# Patient Record
Sex: Female | Born: 1965 | Race: White | Hispanic: No | Marital: Married | Smoking: Never smoker
Health system: Southern US, Community
[De-identification: ages and names within clinical notes are randomized; demographics above are authoritative.]

## PROBLEM LIST (undated history)

## (undated) DIAGNOSIS — Z973 Presence of spectacles and contact lenses: Secondary | ICD-10-CM

## (undated) DIAGNOSIS — G43909 Migraine, unspecified, not intractable, without status migrainosus: Secondary | ICD-10-CM

## (undated) DIAGNOSIS — Z9109 Other allergy status, other than to drugs and biological substances: Secondary | ICD-10-CM

## (undated) DIAGNOSIS — R51 Headache: Secondary | ICD-10-CM

## (undated) DIAGNOSIS — Q359 Cleft palate, unspecified: Secondary | ICD-10-CM

## (undated) DIAGNOSIS — R002 Palpitations: Secondary | ICD-10-CM

## (undated) DIAGNOSIS — T7840XA Allergy, unspecified, initial encounter: Secondary | ICD-10-CM

## (undated) DIAGNOSIS — K219 Gastro-esophageal reflux disease without esophagitis: Secondary | ICD-10-CM

## (undated) DIAGNOSIS — J45909 Unspecified asthma, uncomplicated: Secondary | ICD-10-CM

## (undated) DIAGNOSIS — R519 Headache, unspecified: Secondary | ICD-10-CM

## (undated) DIAGNOSIS — E039 Hypothyroidism, unspecified: Secondary | ICD-10-CM

## (undated) HISTORY — DX: Hypothyroidism, unspecified: E03.9

## (undated) HISTORY — DX: Cleft palate, unspecified: Q35.9

## (undated) HISTORY — DX: Migraine, unspecified, not intractable, without status migrainosus: G43.909

## (undated) HISTORY — DX: Headache, unspecified: R51.9

## (undated) HISTORY — PX: CLEFT PALATE REPAIR: SUR1165

## (undated) HISTORY — DX: Other allergy status, other than to drugs and biological substances: Z91.09

## (undated) HISTORY — PX: COLONOSCOPY: SHX174

## (undated) HISTORY — DX: Unspecified asthma, uncomplicated: J45.909

## (undated) HISTORY — DX: Palpitations: R00.2

## (undated) HISTORY — DX: Gastro-esophageal reflux disease without esophagitis: K21.9

## (undated) HISTORY — DX: Allergy, unspecified, initial encounter: T78.40XA

## (undated) HISTORY — DX: Headache: R51

---

## 1998-10-22 ENCOUNTER — Other Ambulatory Visit: Admission: RE | Admit: 1998-10-22 | Discharge: 1998-10-22 | Payer: Self-pay | Admitting: *Deleted

## 1999-09-23 ENCOUNTER — Other Ambulatory Visit: Admission: RE | Admit: 1999-09-23 | Discharge: 1999-09-23 | Payer: Self-pay | Admitting: *Deleted

## 2000-09-23 ENCOUNTER — Encounter: Admission: RE | Admit: 2000-09-23 | Discharge: 2000-09-23 | Payer: Self-pay | Admitting: Family Medicine

## 2001-08-28 ENCOUNTER — Other Ambulatory Visit: Admission: RE | Admit: 2001-08-28 | Discharge: 2001-08-28 | Payer: Self-pay | Admitting: *Deleted

## 2002-09-24 ENCOUNTER — Other Ambulatory Visit: Admission: RE | Admit: 2002-09-24 | Discharge: 2002-09-24 | Payer: Self-pay | Admitting: *Deleted

## 2004-07-20 ENCOUNTER — Other Ambulatory Visit: Admission: RE | Admit: 2004-07-20 | Discharge: 2004-07-20 | Payer: Self-pay | Admitting: Obstetrics and Gynecology

## 2005-03-23 ENCOUNTER — Ambulatory Visit (HOSPITAL_COMMUNITY): Admission: RE | Admit: 2005-03-23 | Discharge: 2005-03-23 | Payer: Self-pay | Admitting: Obstetrics and Gynecology

## 2005-03-26 ENCOUNTER — Encounter: Admission: RE | Admit: 2005-03-26 | Discharge: 2005-03-26 | Payer: Self-pay | Admitting: Gastroenterology

## 2005-04-07 ENCOUNTER — Encounter: Admission: RE | Admit: 2005-04-07 | Discharge: 2005-04-07 | Payer: Self-pay | Admitting: Obstetrics and Gynecology

## 2005-04-19 ENCOUNTER — Ambulatory Visit (HOSPITAL_COMMUNITY): Admission: RE | Admit: 2005-04-19 | Discharge: 2005-04-19 | Payer: Self-pay | Admitting: Gastroenterology

## 2005-08-30 ENCOUNTER — Other Ambulatory Visit: Admission: RE | Admit: 2005-08-30 | Discharge: 2005-08-30 | Payer: Self-pay | Admitting: Obstetrics and Gynecology

## 2005-10-06 ENCOUNTER — Encounter: Admission: RE | Admit: 2005-10-06 | Discharge: 2005-10-06 | Payer: Self-pay | Admitting: Obstetrics and Gynecology

## 2008-01-24 ENCOUNTER — Encounter: Admission: RE | Admit: 2008-01-24 | Discharge: 2008-01-24 | Payer: Self-pay | Admitting: Obstetrics and Gynecology

## 2008-02-16 ENCOUNTER — Encounter: Admission: RE | Admit: 2008-02-16 | Discharge: 2008-02-16 | Payer: Self-pay | Admitting: Gastroenterology

## 2009-02-04 ENCOUNTER — Encounter: Admission: RE | Admit: 2009-02-04 | Discharge: 2009-02-04 | Payer: Self-pay | Admitting: Obstetrics and Gynecology

## 2010-02-06 ENCOUNTER — Encounter: Admission: RE | Admit: 2010-02-06 | Discharge: 2010-02-06 | Payer: Self-pay | Admitting: Obstetrics and Gynecology

## 2011-02-15 ENCOUNTER — Other Ambulatory Visit: Payer: Self-pay | Admitting: Obstetrics and Gynecology

## 2011-02-15 DIAGNOSIS — Z1231 Encounter for screening mammogram for malignant neoplasm of breast: Secondary | ICD-10-CM

## 2011-02-19 ENCOUNTER — Ambulatory Visit
Admission: RE | Admit: 2011-02-19 | Discharge: 2011-02-19 | Disposition: A | Discharge status: Home / Self Care | Payer: Commercial Indemnity | Source: Ambulatory Visit | Attending: Obstetrics and Gynecology | Admitting: Obstetrics and Gynecology

## 2011-02-19 DIAGNOSIS — Z1231 Encounter for screening mammogram for malignant neoplasm of breast: Secondary | ICD-10-CM

## 2011-03-05 NOTE — Op Note (Signed)
NAME:  Karen Becker, Karen Becker NO.:  [id number]   MEDICAL RECORD NO.:  [id number]          PATIENT TYPE:  AMB   LOCATION:  ENDO                         FACILITY:  [facility]   PHYSICIAN:  Anselmo Rod, M.D.  DATE OF BIRTH:  07-30-1966   DATE OF PROCEDURE:  04/19/2005  DATE OF DISCHARGE:                                 OPERATIVE REPORT   PROCEDURE PERFORMED:  Colonoscopy up to the terminal ileum.   ENDOSCOPIST:  Charna Elizabeth, M.D.   INSTRUMENT USED:  Olympus video colonoscope.   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  Rule out inflammatory  bowel disease.   PREPROCEDURE PREPARATION:  Informed consent was procured from the patient.  The patient was fasted for eight hours prior to the procedure and prepped  with a bottle of magnesium citrate and a gallon of GoLYTELY the night prior  to the procedure.  The risks and benefits of the procedure including a 10%  miss rate for colon polyps or cancers was discussed with the patient as  well.   PREPROCEDURE PHYSICAL:  The patient had stable vital signs.  Neck supple.  Chest clear to auscultation.  S1 and S2 regular.  Abdomen soft with normal  bowel sounds.   DESCRIPTION OF PROCEDURE:  The patient was placed in left lateral decubitus  position and sedated with 100 mg of Demerol and 10 mg of Versed in slow  incremental doses.  Once the patient was adequately sedated and maintained  on low flow oxygen and continuous cardiac monitoring, the Olympus video  colonoscope was advanced from the rectum to the hepatic flexure with  difficulty.  The patient had significant discomfort and therefore the adult  scope was withdrawn and the pediatric adjustable scope used instead.  The  patient's position was changed from the left lateral to the supine position  with gentle  application of abdominal pressure to reach the cecum.  The  appendicular orifice and ileocecal valve were clearly visualized and  photographed.  The terminal ileum appeared healthy.  The entire colonic  mucosa appeared healthy with a normal vascular pattern.  Retroflexion in the  rectum revealed no abnormalities. There was no evidence of Crohn's disease,  no ulcers, erosions, masses, polyps or diverticula were noted.  The patient  had some abdominal discomfort with inflation of air into the colon  indicating a component of visceral hypersensitivity.   IMPRESSION:  1.  Normal colonoscopy up to the terminal ileum.  2.  Indication of visceral hypersensitivity as the patient had abdominal      discomfort with insufflation of air into the colon consistent with      irritable bowel syndrome.   RECOMMENDATIONS:  1.  Continue high fiber diet with liberal fluid intake.  2.  Zelnorm 2 mg by mouth daily.  3.  Outpatient followup as need arises in the future.       JNM/MEDQ  D:  04/19/2005  T:  04/19/2005  Job:  [id number]   cc:   Griffith Citron Mohorn, D.D.S.  [address] [place] [address]  [place], [place] [address]  Fax: [phone]   Bing Neighbors. Sydnee Cabal, MD  Fax: [phone]

## 2011-05-19 ENCOUNTER — Encounter: Payer: Self-pay | Admitting: Podiatrist

## 2012-02-16 ENCOUNTER — Other Ambulatory Visit: Payer: Self-pay | Admitting: Obstetrics and Gynecology

## 2012-02-16 DIAGNOSIS — Z1231 Encounter for screening mammogram for malignant neoplasm of breast: Secondary | ICD-10-CM

## 2012-03-15 ENCOUNTER — Ambulatory Visit
Admission: RE | Admit: 2012-03-15 | Discharge: 2012-03-15 | Disposition: A | Discharge status: Home / Self Care | Payer: Managed Care, Other (non HMO) | Source: Ambulatory Visit | Attending: Obstetrics and Gynecology | Admitting: Obstetrics and Gynecology

## 2012-03-15 DIAGNOSIS — Z1231 Encounter for screening mammogram for malignant neoplasm of breast: Secondary | ICD-10-CM

## 2013-02-05 ENCOUNTER — Other Ambulatory Visit: Payer: Self-pay

## 2013-02-05 DIAGNOSIS — Z1231 Encounter for screening mammogram for malignant neoplasm of breast: Secondary | ICD-10-CM

## 2013-03-19 ENCOUNTER — Ambulatory Visit
Admission: RE | Admit: 2013-03-19 | Discharge: 2013-03-19 | Disposition: A | Discharge status: Home / Self Care | Payer: Managed Care, Other (non HMO) | Source: Ambulatory Visit

## 2013-03-19 DIAGNOSIS — Z1231 Encounter for screening mammogram for malignant neoplasm of breast: Secondary | ICD-10-CM

## 2013-10-18 HISTORY — PX: ABLATION: SHX5711

## 2013-11-26 ENCOUNTER — Encounter: Payer: Self-pay | Admitting: Neurology

## 2013-11-26 ENCOUNTER — Ambulatory Visit (INDEPENDENT_AMBULATORY_CARE_PROVIDER_SITE_OTHER): Payer: Managed Care, Other (non HMO) | Admitting: Neurology

## 2013-11-26 ENCOUNTER — Encounter (INDEPENDENT_AMBULATORY_CARE_PROVIDER_SITE_OTHER): Payer: Self-pay

## 2013-11-26 VITALS — BP 109/71 | HR 73 | Ht 63.5 in | Wt 128.2 lb

## 2013-11-26 DIAGNOSIS — R413 Other amnesia: Secondary | ICD-10-CM

## 2013-11-26 DIAGNOSIS — G3184 Mild cognitive impairment, so stated: Secondary | ICD-10-CM

## 2013-11-26 MED ORDER — CEREFOLIN 6-1-50-5 MG PO TABS: 1.0000 | ORAL_TABLET | Freq: Every morning | ORAL | Status: DC

## 2013-11-26 NOTE — Patient Instructions (Signed)
I had a long discussion with the patient with regards to her mild memory impairment and discussed her risk of developing Alzheimer's given family history of the same in her mother. We also discussed at length the available blood test for APOE genetic subtyping testing as well as PET scan with amyloid labeling to detect patients at risk for Alzheimer's. However since we do not have any effective treatment strategies to lower her risk for developing Alzheimer`s  I do not believe doing extensive diagnostic testing is indicated at the present time. I recommend she try Cerefolin nac 1 tablet daily., fish oil capsule daily and participate in cognitively challenging activities like crossword puzzles,sudoku and playing bridge.. Return for followup in 6 months or call earlier if necessary

## 2013-11-26 NOTE — Progress Notes (Signed)
[facility] [address] [place]. [place] [address] [phone]       OFFICE CONSULT NOTE  [name] Date of Birth:  07-22-1966 Medical Record Number:  [id number]   Referring MD:  Maurice Small  Reason for Referral:  Memory difficulties and fear of Alzheimer`s   HPI: 23 year Caucasian lady whose mother was diagnosed by me with Alzheimer`s last December and has been worried that she may be at risk for the same and is here to discuss doing genetic testing if it will be helpful. She admits to being under stress from doing 2 jobs and helping with her mom moving back to [place] recently. She has difficulty remembering names and faces of clients whom she has seen has met once. She can remember past information well and can perform her tasks without problem.She denies significant headache, weakness, gait difficulty, fall, head injury with loss of consciousness, stroke, seizures. She has h/o rare migraines usually brought on by being on vacation.She has remote h/o trigeminal neuralgia years ago and had brain MRI in [place] but I do not have report or films to review today. She denies h/o depression but admit to being very anxious recently.  ROS:   14 system review of systems is positive for memory loss, anxeity, depression and migraines only and all other systems negative  PMH:  Past Medical History  Diagnosis Date  . GERD (gastroesophageal reflux disease)   . Hypothyroidism   . Environmental allergies   . 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 Marital Status: Married    Spouse Name: bill    Number of Children: 0  . Years of Education: college   Occupational History  . IT sales    Social History Main Topics  . Smoking status: Never Smoker   . Smokeless tobacco: Not on file  . Alcohol Use: Yes  . Drug Use: No  . Sexual Activity: Not on file   Other Topics Concern  . Not on file   Social History Narrative  .  No narrative on file    Medications:   Current Outpatient Prescriptions on File Prior to Visit  Medication Sig Dispense Refill  . esomeprazole (NEXIUM) 40 MG capsule Take 40 mg by mouth daily before breakfast.        . Evening Primrose Oil 1000 MG CAPS Take by mouth.        . levocetirizine (XYZAL) 5 MG tablet Take 10 mg by mouth daily.       Marland Kitchen levothyroxine (SYNTHROID, LEVOTHROID) 112 MCG tablet Take 112 mcg by mouth daily.         No current facility-administered medications on file prior to visit.    Allergies:   Allergies  Allergen Reactions  . Adhesive [Tape]   . Latex     Physical Exam General: well developed, well nourished, seated, in no evident distress Head: head normocephalic and atraumatic. Orohparynx benign Neck: supple with no carotid or supraclavicular bruits Cardiovascular: regular rate and rhythm, no murmurs Musculoskeletal: no deformity Skin:  no rash/petichiae Vascular:  Normal pulses all extremities Filed Vitals:   11/26/13 1428  BP: 109/71  Pulse: 73    Neurologic Exam Mental Status: Awake and fully alert. Oriented to place and time. Recent and remote memory intact. Attention span, concentration and fund of knowledge appropriate. Mood and affect appropriate. MMSE 29/30 with deficit in drawing only. MOCA 26/30 with deficits  in delayed recall. Geriatric Depression scale not depressed. Cranial Nerves: Fundoscopic exam reveals sharp disc margins. Pupils equal, briskly reactive to light. Extraocular movements full without nystagmus. Visual fields full to confrontation. Hearing intact. Facial sensation intact. Face, tongue, palate moves normally and symmetrically.  Motor: Normal bulk and tone. Normal strength in all tested extremity muscles. Sensory.: intact to touch and pinprick and vibratory sensation.  Coordination: Rapid alternating movements normal in all extremities. Finger-to-nose and heel-to-shin performed accurately bilaterally. Gait and Station:  Arises from chair without difficulty. Stance is normal. Gait demonstrates normal stride length and balance . Able to heel, toe and tandem walk without difficulty.  Reflexes: 1+ and symmetric. Toes downgoing.    ASSESSMENT:  37 year Caucasian lady with subjective mild memory difficulties but unremarkable cognitive testing- probably anxeity  Rather than  mild cognitive impairment. Patient has family h/o alzheimer`s and significant anticipatory anxeity over this.   PLAN: I  had a long discussion with the patient with regards to her mild memory impairment and discussed her risk of developing Alzheimer's given family history of the same in her mother. We also discussed at length the available blood test for APOE genetic subtyping testing as well as PET scan with amyloid labeling to detect patients at risk for Alzheimer's. However since we do not have any effective treatment strategies to lower her risk for developing Alzheimer`s  I do not believe doing extensive diagnostic testing is indicated at the present time. I recommend she try Cerefolin nac 1 tablet daily., fish oil capsule daily and participate in cognitively challenging activities like crossword puzzles,sudoku and playing bridge.I recommend conservative f/u for now and will not order any diagnostic testing unless she gets worse . Return for followup in 6 months or call earlier if necessary   Note: This document was prepared with digital dictation and possible smart phrase technology. Any transcriptional errors that result from this process are unintentional.

## 2013-12-11 ENCOUNTER — Telehealth: Payer: Self-pay | Admitting: Neurology

## 2013-12-11 MED ORDER — CEREFOLIN NAC 6-90.314-2-600 MG PO TABS: 1.0000 | ORAL_TABLET | Freq: Every day | ORAL | Status: DC

## 2013-12-11 NOTE — Telephone Encounter (Signed)
States medication was filled wrong and should not have been sent to her insurance company but acquired through [facility]. She has received the generic brand and states the ingredients are not the same and she wants the actual brand name. Also wants to be called when this has been handled.

## 2013-12-11 NOTE — Telephone Encounter (Signed)
By viewing the chart, it appears at last OV [facility]  Patient prefers to use [facility].  I have resent the Rx to them.  I called the patient back.  Got no answer.  Left message.

## 2014-01-15 ENCOUNTER — Ambulatory Visit (INDEPENDENT_AMBULATORY_CARE_PROVIDER_SITE_OTHER): Payer: Managed Care, Other (non HMO) | Admitting: Cardiology

## 2014-01-15 ENCOUNTER — Encounter: Payer: Self-pay | Admitting: Cardiology

## 2014-01-15 VITALS — BP 100/62 | HR 62 | Ht 64.0 in | Wt 126.0 lb

## 2014-01-15 DIAGNOSIS — R002 Palpitations: Secondary | ICD-10-CM | POA: Insufficient documentation

## 2014-01-15 NOTE — Patient Instructions (Signed)
Your physician recommends that you continue on your current medications as directed. Please refer to the Current Medication list given to you today.   Your physician recommends that you schedule a follow-up appointment as needed  

## 2014-01-15 NOTE — Progress Notes (Signed)
[address] [place], [place]  [address] Phone: [phone] Fax:  [phone]  Date:  01/15/2014   ID:  Karen Becker, DOB April 09, 1966, MRN [id number]  PCP:  Frederich Chick, MD   History of Present Illness: Karen Becker is a 48 y.o. female here for evaluation of palpitations. She's been having palpitations at least once per week over the past several months usually lasting a few seconds duration with no associated pain. 4 weeks ago was having 3 a day. Has not had one in a week. Pre menstrual week more palpitations. She is very physically active, no syncope, no heart failure, no family history of sudden death or atrial fibrillation. Her mother was recently diagnosed and grandfather with atrial fibrillation. Thyroid/TSH was normal. Rare caffeinated drinks. She has never smoked. She is an [employer]. Creatinine is 0.94, potassium 4.0, hemoglobin 13.8, magnesium 1.9, LDL cholesterol 104, HDL 55. Prior note from Dr. Hyman Hopes reviewed.  Mother stroke atrial fibrillation. I took care of her while she was here in town. She is now back in [place].   Wt Readings from Last 3 Encounters:  01/15/14 126 lb (57.153 kg)  11/26/13 128 lb 3.2 oz (58.151 kg)     Past Medical History  Diagnosis Date  . GERD (gastroesophageal reflux disease)   . Hypothyroidism   . Environmental allergies   . 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 Ablation      Tonsils  . Cleft palate repair      Current Outpatient Prescriptions  Medication Sig Dispense Refill  . albuterol (ACCUNEB) 1.25 MG/3ML nebulizer solution Take 1 ampule by nebulization every 6 (six) hours as needed for wheezing (sport induced).      Marland Kitchen buPROPion (WELLBUTRIN XL) 300 MG 24 hr tablet Take 300 mg by mouth daily. Pt taking 150 and 300 mg alternating      . cetirizine (ZYRTEC) 10 MG tablet Take 10 mg by mouth daily.      Marland Kitchen esomeprazole (NEXIUM) 40 MG capsule Take 40 mg by mouth  daily before breakfast.        . Evening Primrose Oil 1000 MG CAPS Take by mouth.        Bennetta Laos Factor (INTRINSI B12-FOLATE PO) Take by mouth.      . IPRATROPIUM BROMIDE IN Inhale into the lungs.      Marland Kitchen levothyroxine (SYNTHROID, LEVOTHROID) 112 MCG tablet Take 112 mcg by mouth daily.        . LO LOESTRIN FE 1 MG-10 MCG / 10 MCG tablet       . Methylfol-Algae-B12-Acetylcyst (CEREFOLIN NAC) 6-90.314-2-600 MG TABS Take 1 tablet by mouth daily.  90 tablet  1  . Saccharomyces boulardii (FLORASTOR PO) Take by mouth.       No current facility-administered medications for this visit.    Allergies:    Allergies  Allergen Reactions  . Adhesive [Tape]   . Latex     Social History:  The patient  reports that she has never smoked. She does not have any smokeless tobacco history on file. She reports that she drinks alcohol. She reports that she does not use illicit drugs.   Family History  Problem Relation Age of Onset  . Alzheimer's disease Mother   . Atrial fibrillation Mother   . Bipolar disorder Father   . Diabetes Mellitus II Father     ROS:  Please see the history of present illness.   Denies any syncope, bleeding, orthopnea, PND, chest pain, shortness of breath   All other systems reviewed and negative.   PHYSICAL EXAM: VS:  Ht 5\' 4"  (1.626 m)  Wt 126 lb (57.153 kg)  BMI 21.62 kg/m2  LMP 12/31/2013 Well nourished, well developed, in no acute distress HEENT: normal, [place]/AT, EOMI Neck: no JVD, normal carotid upstroke, no bruit Cardiac:  normal S1, S2; RRR; no murmur Lungs:  clear to auscultation bilaterally, no wheezing, rhonchi or rales Abd: soft, nontender, no hepatomegaly, no bruits Ext: no edema, 2+ distal pulses Skin: warm and dry GU: deferred Neuro: no focal abnormalities noted, AAO x 3  EKG:  01/15/14-Sinus rhythm rate 62 with vertical axis. Poor R wave progression.     ASSESSMENT AND PLAN:  1. Palpitations - most likely PVCs or PACs, premature  ventricular contractions or premature atrial contractions. No high-risk symptoms such as syncope, angina, prolonged racing. If she begins to experience more frequent symptoms, she will call me and we will contemplate 24-hour Holter monitor at that time. EKG currently reassuring. She knows to avoid decongestants. No excessive caffeine use. I do believe that her symptoms seem to correlate with her menstrual cycle and may be slightly hormonal influence. I also discussed with her that if palpitations were to become more significant, he could trial low-dose calcium channel blocker such as diltiazem.  Remembers this medicine because this is what her mother who lives in [place] had. No murmurs. I do not feel strongly that an echocardiogram is indicated. EKG unremarkable. She has noticed no change in her physical stamina. She also has sports-induced asthma and will occasionally take albuterol. This may also as a beta agonist stimulate PVCs or PACs. 2. As needed followup  Signed, Donato SchultzMark Clariece Roesler, MD [facility]  01/15/2014 11:16 AM

## 2014-02-21 ENCOUNTER — Other Ambulatory Visit: Payer: Self-pay

## 2014-02-21 DIAGNOSIS — Z1231 Encounter for screening mammogram for malignant neoplasm of breast: Secondary | ICD-10-CM

## 2014-04-01 ENCOUNTER — Ambulatory Visit
Admission: RE | Admit: 2014-04-01 | Discharge: 2014-04-01 | Disposition: A | Discharge status: Home / Self Care | Payer: Managed Care, Other (non HMO) | Source: Ambulatory Visit

## 2014-04-01 DIAGNOSIS — Z1231 Encounter for screening mammogram for malignant neoplasm of breast: Secondary | ICD-10-CM

## 2014-05-13 ENCOUNTER — Other Ambulatory Visit: Payer: Self-pay

## 2014-05-13 MED ORDER — CEREFOLIN NAC 6-90.314-2-600 MG PO TABS: 1.0000 | ORAL_TABLET | Freq: Every day | ORAL | Status: DC

## 2014-06-12 ENCOUNTER — Other Ambulatory Visit: Payer: Self-pay | Admitting: Otolaryngology

## 2014-06-12 DIAGNOSIS — R51 Headache: Principal | ICD-10-CM

## 2014-06-12 DIAGNOSIS — J320 Chronic maxillary sinusitis: Secondary | ICD-10-CM

## 2014-06-12 DIAGNOSIS — R519 Headache, unspecified: Secondary | ICD-10-CM

## 2014-06-17 ENCOUNTER — Ambulatory Visit
Admission: RE | Admit: 2014-06-17 | Discharge: 2014-06-17 | Disposition: A | Discharge status: Home / Self Care | Payer: Managed Care, Other (non HMO) | Source: Ambulatory Visit | Attending: Otolaryngology | Admitting: Otolaryngology

## 2014-06-17 DIAGNOSIS — R519 Headache, unspecified: Secondary | ICD-10-CM

## 2014-06-17 DIAGNOSIS — R51 Headache: Principal | ICD-10-CM

## 2014-06-17 DIAGNOSIS — J320 Chronic maxillary sinusitis: Secondary | ICD-10-CM

## 2014-06-18 ENCOUNTER — Other Ambulatory Visit: Payer: Managed Care, Other (non HMO)

## 2014-08-02 ENCOUNTER — Other Ambulatory Visit: Payer: Self-pay

## 2014-08-11 ENCOUNTER — Other Ambulatory Visit: Payer: Self-pay

## 2014-08-11 MED ORDER — CEREFOLIN NAC 6-90.314-2-600 MG PO TABS: 1.0000 | ORAL_TABLET | Freq: Every day | ORAL | Status: DC

## 2014-08-15 ENCOUNTER — Telehealth: Payer: Self-pay | Admitting: Neurology

## 2014-08-15 NOTE — Telephone Encounter (Signed)
Patient requesting a year long script for Cerefolin instead of a 30 day script, wants it to be sent to the [facility], if questions, please call and advise.

## 2014-08-15 NOTE — Telephone Encounter (Signed)
Rx was sent for 90 day supply on 10/25.  I called the patient back, got no answer.  Left message.

## 2014-10-25 ENCOUNTER — Other Ambulatory Visit: Payer: Self-pay | Admitting: Neurology

## 2014-11-06 ENCOUNTER — Other Ambulatory Visit: Payer: Self-pay | Admitting: Family Medicine

## 2014-11-06 DIAGNOSIS — M25521 Pain in right elbow: Secondary | ICD-10-CM

## 2014-11-13 ENCOUNTER — Ambulatory Visit
Admission: RE | Admit: 2014-11-13 | Discharge: 2014-11-13 | Disposition: A | Discharge status: Home / Self Care | Payer: Managed Care, Other (non HMO) | Source: Ambulatory Visit | Attending: Family Medicine | Admitting: Family Medicine

## 2014-11-13 DIAGNOSIS — M25521 Pain in right elbow: Secondary | ICD-10-CM

## 2014-11-18 HISTORY — PX: SEPTOPLASTY: SUR1290

## 2014-11-25 ENCOUNTER — Other Ambulatory Visit: Payer: Self-pay | Admitting: Orthopedic Surgery

## 2014-11-27 ENCOUNTER — Encounter (HOSPITAL_BASED_OUTPATIENT_CLINIC_OR_DEPARTMENT_OTHER): Payer: Self-pay | Admitting: *Deleted

## 2014-12-02 ENCOUNTER — Ambulatory Visit (HOSPITAL_BASED_OUTPATIENT_CLINIC_OR_DEPARTMENT_OTHER): Payer: Managed Care, Other (non HMO) | Admitting: Anesthesiology

## 2014-12-02 ENCOUNTER — Ambulatory Visit (HOSPITAL_BASED_OUTPATIENT_CLINIC_OR_DEPARTMENT_OTHER)
Admission: RE | Admit: 2014-12-02 | Discharge: 2014-12-02 | Disposition: A | Discharge status: Home / Self Care | Payer: Managed Care, Other (non HMO) | Source: Ambulatory Visit | Attending: Orthopedic Surgery | Admitting: Orthopedic Surgery

## 2014-12-02 ENCOUNTER — Encounter (HOSPITAL_BASED_OUTPATIENT_CLINIC_OR_DEPARTMENT_OTHER)
Admission: RE | Disposition: A | Discharge status: Home / Self Care | Payer: Self-pay | Source: Ambulatory Visit | Attending: Orthopedic Surgery

## 2014-12-02 ENCOUNTER — Encounter (HOSPITAL_BASED_OUTPATIENT_CLINIC_OR_DEPARTMENT_OTHER): Payer: Self-pay | Admitting: Anesthesiology

## 2014-12-02 DIAGNOSIS — K219 Gastro-esophageal reflux disease without esophagitis: Secondary | ICD-10-CM | POA: Insufficient documentation

## 2014-12-02 DIAGNOSIS — M7711 Lateral epicondylitis, right elbow: Secondary | ICD-10-CM | POA: Insufficient documentation

## 2014-12-02 DIAGNOSIS — E039 Hypothyroidism, unspecified: Secondary | ICD-10-CM | POA: Diagnosis not present

## 2014-12-02 DIAGNOSIS — S56511A Strain of other extensor muscle, fascia and tendon at forearm level, right arm, initial encounter: Secondary | ICD-10-CM | POA: Diagnosis not present

## 2014-12-02 DIAGNOSIS — F1099 Alcohol use, unspecified with unspecified alcohol-induced disorder: Secondary | ICD-10-CM | POA: Insufficient documentation

## 2014-12-02 DIAGNOSIS — Z91040 Latex allergy status: Secondary | ICD-10-CM | POA: Diagnosis not present

## 2014-12-02 DIAGNOSIS — M25521 Pain in right elbow: Secondary | ICD-10-CM | POA: Diagnosis present

## 2014-12-02 HISTORY — DX: Presence of spectacles and contact lenses: Z97.3

## 2014-12-02 HISTORY — PX: TENDON RECONSTRUCTION: SHX2487

## 2014-12-02 SURGERY — RECONSTRUCTION, TENDON OR LIGAMENT, ELBOW
Anesthesia: General | Site: Elbow | Laterality: Right

## 2014-12-02 MED ORDER — PROPOFOL 10 MG/ML IV BOLUS
INTRAVENOUS | Status: DC | PRN
  Administered 2014-12-02: 200 mg via INTRAVENOUS

## 2014-12-02 MED ORDER — LIDOCAINE HCL (CARDIAC) 20 MG/ML IV SOLN
INTRAVENOUS | Status: DC | PRN
  Administered 2014-12-02: 50 mg via INTRAVENOUS

## 2014-12-02 MED ORDER — MIDAZOLAM HCL 5 MG/5ML IJ SOLN
INTRAMUSCULAR | Status: DC | PRN
  Administered 2014-12-02: 2 mg via INTRAVENOUS

## 2014-12-02 MED ORDER — HYDROMORPHONE HCL 1 MG/ML IJ SOLN
0.2500 mg | INTRAMUSCULAR | Status: DC | PRN
  Administered 2014-12-02 (×2): 0.25 mg via INTRAVENOUS

## 2014-12-02 MED ORDER — OXYCODONE HCL 5 MG/5ML PO SOLN: 5.0000 mg | Freq: Once | ORAL | Status: DC | PRN

## 2014-12-02 MED ORDER — EPHEDRINE SULFATE 50 MG/ML IJ SOLN
INTRAMUSCULAR | Status: DC | PRN
  Administered 2014-12-02 (×2): 10 mg via INTRAVENOUS

## 2014-12-02 MED ORDER — ONDANSETRON HCL 4 MG/2ML IJ SOLN
INTRAMUSCULAR | Status: DC | PRN
  Administered 2014-12-02: 4 mg via INTRAVENOUS

## 2014-12-02 MED ORDER — BUPIVACAINE HCL (PF) 0.5 % IJ SOLN
INTRAMUSCULAR | Status: DC | PRN
  Administered 2014-12-02: 10 mL

## 2014-12-02 MED ORDER — MIDAZOLAM HCL 2 MG/2ML IJ SOLN
INTRAMUSCULAR | Status: AC
  Filled 2014-12-02: qty 2

## 2014-12-02 MED ORDER — LACTATED RINGERS IV SOLN
INTRAVENOUS | Status: DC | PRN
  Administered 2014-12-02 (×2): via INTRAVENOUS

## 2014-12-02 MED ORDER — CEFAZOLIN SODIUM-DEXTROSE 2-3 GM-% IV SOLR
2.0000 g | INTRAVENOUS | Status: AC
  Administered 2014-12-02: 2 g via INTRAVENOUS

## 2014-12-02 MED ORDER — OXYCODONE HCL 5 MG PO TABS: 5.0000 mg | ORAL_TABLET | Freq: Once | ORAL | Status: DC | PRN

## 2014-12-02 MED ORDER — BUPIVACAINE HCL (PF) 0.5 % IJ SOLN
INTRAMUSCULAR | Status: AC
  Filled 2014-12-02: qty 30

## 2014-12-02 MED ORDER — FENTANYL CITRATE 0.05 MG/ML IJ SOLN
INTRAMUSCULAR | Status: AC
  Filled 2014-12-02: qty 6

## 2014-12-02 MED ORDER — PROPOFOL 10 MG/ML IV EMUL
INTRAVENOUS | Status: AC
  Filled 2014-12-02: qty 50

## 2014-12-02 MED ORDER — FENTANYL CITRATE 0.05 MG/ML IJ SOLN
INTRAMUSCULAR | Status: AC
  Filled 2014-12-02: qty 2

## 2014-12-02 MED ORDER — LACTATED RINGERS IV SOLN: INTRAVENOUS | Status: DC

## 2014-12-02 MED ORDER — CEFAZOLIN SODIUM-DEXTROSE 2-3 GM-% IV SOLR
INTRAVENOUS | Status: AC
  Filled 2014-12-02: qty 50

## 2014-12-02 MED ORDER — MIDAZOLAM HCL 2 MG/2ML IJ SOLN: 1.0000 mg | INTRAMUSCULAR | Status: DC | PRN

## 2014-12-02 MED ORDER — FENTANYL CITRATE 0.05 MG/ML IJ SOLN
INTRAMUSCULAR | Status: DC | PRN
  Administered 2014-12-02 (×2): 50 ug via INTRAVENOUS

## 2014-12-02 MED ORDER — OXYCODONE-ACETAMINOPHEN 5-325 MG PO TABS: 1.0000 | ORAL_TABLET | ORAL | Status: DC | PRN

## 2014-12-02 MED ORDER — FENTANYL CITRATE 0.05 MG/ML IJ SOLN: 50.0000 ug | INTRAMUSCULAR | Status: DC | PRN

## 2014-12-02 MED ORDER — HYDROMORPHONE HCL 1 MG/ML IJ SOLN
INTRAMUSCULAR | Status: AC
  Filled 2014-12-02: qty 1

## 2014-12-02 MED ORDER — ONDANSETRON HCL 4 MG/2ML IJ SOLN: 4.0000 mg | Freq: Once | INTRAMUSCULAR | Status: DC | PRN

## 2014-12-02 MED ORDER — DOCUSATE SODIUM 100 MG PO CAPS: 100.0000 mg | ORAL_CAPSULE | Freq: Three times a day (TID) | ORAL | Status: DC | PRN

## 2014-12-02 MED ORDER — POVIDONE-IODINE 7.5 % EX SOLN: Freq: Once | CUTANEOUS | Status: DC

## 2014-12-02 MED ORDER — DEXAMETHASONE SODIUM PHOSPHATE 10 MG/ML IJ SOLN
INTRAMUSCULAR | Status: DC | PRN
  Administered 2014-12-02: 10 mg via INTRAVENOUS

## 2014-12-02 MED ORDER — PROPOFOL 10 MG/ML IV BOLUS
INTRAVENOUS | Status: AC
  Filled 2014-12-02: qty 80

## 2014-12-02 SURGICAL SUPPLY — 73 items
ANCH SUT 2 SHRT 1.45 DRLBT (Orthopedic Implant) ×1 IMPLANT
ANCHOR JUGGERKNOT W/DRL 2/1.45 (Orthopedic Implant) ×2 IMPLANT
BANDAGE ELASTIC 4 VELCRO ST LF (GAUZE/BANDAGES/DRESSINGS) ×6 IMPLANT
BLADE SURG 15 STRL LF DISP TIS (BLADE) ×1 IMPLANT
BLADE SURG 15 STRL SS (BLADE) ×3
BNDG CMPR 9X4 STRL LF SNTH (GAUZE/BANDAGES/DRESSINGS) ×1
BNDG ESMARK 4X9 LF (GAUZE/BANDAGES/DRESSINGS) ×3 IMPLANT
BUR EGG/OVAL CARBIDE (BURR) ×3 IMPLANT
CANISTER SUCT 1200ML W/VALVE (MISCELLANEOUS) IMPLANT
CHLORAPREP W/TINT 26ML (MISCELLANEOUS) ×3 IMPLANT
CLOSURE WOUND 1/2 X4 (GAUZE/BANDAGES/DRESSINGS) ×1
CORDS BIPOLAR (ELECTRODE) ×3 IMPLANT
COVER BACK TABLE 60X90IN (DRAPES) ×3 IMPLANT
COVER MAYO STAND STRL (DRAPES) ×3 IMPLANT
DECANTER SPIKE VIAL GLASS SM (MISCELLANEOUS) IMPLANT
DRAPE EXTREMITY T 121X128X90 (DRAPE) ×3 IMPLANT
DRAPE OEC MINIVIEW 54X84 (DRAPES) ×3 IMPLANT
DRAPE SURG 17X23 STRL (DRAPES) ×3 IMPLANT
DRAPE U 20/CS (DRAPES) ×3 IMPLANT
DRAPE U-SHAPE 47X51 STRL (DRAPES) IMPLANT
ELECT REM PT RETURN 9FT ADLT (ELECTROSURGICAL) ×3
ELECTRODE REM PT RTRN 9FT ADLT (ELECTROSURGICAL) ×1 IMPLANT
GAUZE SPONGE 4X4 12PLY STRL (GAUZE/BANDAGES/DRESSINGS) ×3 IMPLANT
GLOVE BIO SURGEON STRL SZ7 (GLOVE) ×3 IMPLANT
GLOVE BIO SURGEON STRL SZ7.5 (GLOVE) ×3 IMPLANT
GLOVE BIOGEL PI IND STRL 7.0 (GLOVE) ×1 IMPLANT
GLOVE BIOGEL PI IND STRL 8 (GLOVE) ×1 IMPLANT
GLOVE BIOGEL PI INDICATOR 7.0 (GLOVE) ×2
GLOVE BIOGEL PI INDICATOR 8 (GLOVE) ×2
GOWN STRL REUS W/ TWL LRG LVL3 (GOWN DISPOSABLE) ×1 IMPLANT
GOWN STRL REUS W/ TWL XL LVL3 (GOWN DISPOSABLE) ×1 IMPLANT
GOWN STRL REUS W/TWL LRG LVL3 (GOWN DISPOSABLE) ×3
GOWN STRL REUS W/TWL XL LVL3 (GOWN DISPOSABLE) ×3
NDL HYPO 25X1 1.5 SAFETY (NEEDLE) IMPLANT
NEEDLE HYPO 25X1 1.5 SAFETY (NEEDLE) IMPLANT
NS IRRIG 1000ML POUR BTL (IV SOLUTION) ×3 IMPLANT
PACK BASIN DAY SURGERY FS (CUSTOM PROCEDURE TRAY) ×3 IMPLANT
PAD CAST 4YDX4 CTTN HI CHSV (CAST SUPPLIES) ×2 IMPLANT
PADDING CAST ABS 4INX4YD NS (CAST SUPPLIES) ×2
PADDING CAST ABS COTTON 4X4 ST (CAST SUPPLIES) ×1 IMPLANT
PADDING CAST COTTON 4X4 STRL (CAST SUPPLIES) ×6
PENCIL BUTTON HOLSTER BLD 10FT (ELECTRODE) ×3 IMPLANT
SPLINT FAST PLASTER 5X30 (CAST SUPPLIES)
SPLINT PLASTER CAST FAST 5X30 (CAST SUPPLIES) IMPLANT
SPLINT PLASTER CAST XFAST 4X15 (CAST SUPPLIES) IMPLANT
SPLINT PLASTER XTRA FAST SET 4 (CAST SUPPLIES)
SPONGE LAP 4X18 X RAY DECT (DISPOSABLE) ×3 IMPLANT
STOCKINETTE 4X48 STRL (DRAPES) ×3 IMPLANT
STRIP CLOSURE SKIN 1/2X4 (GAUZE/BANDAGES/DRESSINGS) ×2 IMPLANT
SUCTION FRAZIER TIP 10 FR DISP (SUCTIONS) IMPLANT
SUT 2 FIBERLOOP 20 STRT BLUE (SUTURE) ×6
SUT FIBERWIRE #2 38 T-5 BLUE (SUTURE)
SUT MNCRL AB 4-0 PS2 18 (SUTURE) IMPLANT
SUT SILK 3 0 TIES 17X18 (SUTURE)
SUT SILK 3-0 18XBRD TIE BLK (SUTURE) IMPLANT
SUT TIGER TAPE 7 IN WHITE (SUTURE) IMPLANT
SUT VIC AB 0 CT1 27 (SUTURE)
SUT VIC AB 0 CT1 27XBRD ANBCTR (SUTURE) IMPLANT
SUT VIC AB 2-0 SH 27 (SUTURE)
SUT VIC AB 2-0 SH 27XBRD (SUTURE) IMPLANT
SUT VIC AB 3-0 SH 27 (SUTURE)
SUT VIC AB 3-0 SH 27X BRD (SUTURE) IMPLANT
SUT VICRYL AB 3 0 TIES (SUTURE) IMPLANT
SUTURE 2 FIBERLOOP 20 STRT BLU (SUTURE) ×2 IMPLANT
SUTURE FIBERWR #2 38 T-5 BLUE (SUTURE) IMPLANT
SYR BULB 3OZ (MISCELLANEOUS) ×3 IMPLANT
SYR CONTROL 10ML LL (SYRINGE) IMPLANT
TAPE FIBER 2MM 7IN #2 BLUE (SUTURE) IMPLANT
TOWEL OR 17X24 6PK STRL BLUE (TOWEL DISPOSABLE) ×3 IMPLANT
TOWEL OR NON WOVEN STRL DISP B (DISPOSABLE) ×3 IMPLANT
TUBE CONNECTING 20'X1/4 (TUBING)
TUBE CONNECTING 20X1/4 (TUBING) IMPLANT
UNDERPAD 30X30 INCONTINENT (UNDERPADS AND DIAPERS) ×3 IMPLANT

## 2014-12-02 NOTE — Anesthesia Procedure Notes (Signed)
Procedure Name: LMA Insertion Date/Time: 12/02/2014 11:24 AM Performed by: Genevieve NorlanderLINKA, Karen Becker Pre-anesthesia Checklist: Patient identified, Emergency Drugs available, Suction available and Patient being monitored Patient Re-evaluated:Patient Re-evaluated prior to inductionOxygen Delivery Method: Circle System Utilized Preoxygenation: Pre-oxygenation with 100% oxygen Intubation Type: IV induction Ventilation: Mask ventilation without difficulty LMA: LMA inserted LMA Size: 4.0 Number of attempts: 1 Airway Equipment and Method: Bite block Placement Confirmation: positive ETCO2 Tube secured with: Tape Dental Injury: Teeth and Oropharynx as per pre-operative assessment

## 2014-12-02 NOTE — Discharge Instructions (Signed)
Discharge Instructions after Open Elbow Surgery   Wear your wrist brace at all times except for bathing. Use ice on the elbow intermittently over the first 48 hours after surgery. Okay for gentle range of motion of the elbow as comfortable  Pain medicine has been prescribed for you.  Use your medicine liberally over the first 48 hours, and then you can begin to taper your use. You may take Extra Strength Tylenol or Tylenol only in place of the pain pills.  Leave the dressing in place for 2 days and then remove the dressing.  You may shower 5 days after surgery.  Take one aspirin a day for 2 weeks after surgery, unless you have an aspirin sensitivity/ allergy or asthma.   Please call [phone] during normal business hours or [phone] after hours for any problems. Including the following:  - excessive redness of the incisions - drainage for more than 4 days - fever of more than 101.5 F  *Please note that pain medications will not be refilled after hours or on weekends.    Post Anesthesia Home Care Instructions  Activity: Get plenty of rest for the remainder of the day. A responsible adult should stay with you for 24 hours following the procedure.  For the next 24 hours, DO NOT: -Drive a car -Advertising copywriterperate machinery -Drink alcoholic beverages -Take any medication unless instructed by your physician -Make any legal decisions or sign important papers.  Meals: Start with liquid foods such as gelatin or soup. Progress to regular foods as tolerated. Avoid greasy, spicy, heavy foods. If nausea and/or vomiting occur, drink only clear liquids until the nausea and/or vomiting subsides. Call your physician if vomiting continues.  Special Instructions/Symptoms: Your throat may feel dry or sore from the anesthesia or the breathing tube placed in your throat during surgery. If this causes discomfort, gargle with warm salt water. The discomfort should disappear within 24 hours.

## 2014-12-02 NOTE — Anesthesia Postprocedure Evaluation (Signed)
  Anesthesia Post-op Note  Patient: Karen Becker  Procedure(s) Performed: Procedure(s) with comments: Right elbow common extensor repair (Right) - Right elbow common extensor repair  Patient Location: PACU  Anesthesia Type: General   Level of Consciousness: awake, alert  and oriented  Airway and Oxygen Therapy: Patient Spontanous Breathing  Post-op Pain: none  Post-op Assessment: Post-op Vital signs reviewed  Post-op Vital Signs: Reviewed  Last Vitals:  Filed Vitals:   12/02/14 1245  BP: 119/67  Pulse: 66  Temp:   Resp: 15    Complications: No apparent anesthesia complications

## 2014-12-02 NOTE — Transfer of Care (Signed)
Immediate Anesthesia Transfer of Care Note  Patient: Karen MarvelKarla Winemiller  Procedure(s) Performed: Procedure(s) with comments: Right elbow common extensor repair (Right) - Right elbow common extensor repair  Patient Location: PACU  Anesthesia Type:General  Level of Consciousness: sedated  Airway & Oxygen Therapy: Patient Spontanous Breathing and Patient connected to face mask oxygen  Post-op Assessment: Report given to RN and Post -op Vital signs reviewed and stable  Post vital signs: Reviewed and stable  Last Vitals:  Filed Vitals:   12/02/14 1213  BP:   Pulse: 67  Temp:   Resp: 15    Complications: No apparent anesthesia complications

## 2014-12-02 NOTE — Anesthesia Preprocedure Evaluation (Signed)
Anesthesia Evaluation  Patient identified by MRN, date of birth, ID band Patient awake    Reviewed: Allergy & Precautions, NPO status , Patient's Chart, lab work & pertinent test results  Airway Mallampati: I  TM Distance: >3 FB Neck ROM: Full    Dental  (+) Teeth Intact, Dental Advisory Given   Pulmonary  breath sounds clear to auscultation        Cardiovascular Rhythm:Regular Rate:Normal     Neuro/Psych    GI/Hepatic GERD-  Medicated and Controlled,  Endo/Other  Hypothyroidism   Renal/GU      Musculoskeletal   Abdominal   Peds  Hematology   Anesthesia Other Findings   Reproductive/Obstetrics                             Anesthesia Physical Anesthesia Plan  ASA: II  Anesthesia Plan: General   Post-op Pain Management:    Induction: Intravenous  Airway Management Planned: LMA  Additional Equipment:   Intra-op Plan:   Post-operative Plan: Extubation in OR  Informed Consent: I have reviewed the patients History and Physical, chart, labs and discussed the procedure including the risks, benefits and alternatives for the proposed anesthesia with the patient or authorized representative who has indicated his/her understanding and acceptance.   Dental advisory given  Plan Discussed with: CRNA, Surgeon and Anesthesiologist  Anesthesia Plan Comments:         Anesthesia Quick Evaluation

## 2014-12-02 NOTE — H&P (Signed)
Karen Becker is an 49 y.o. female.   Chief Complaint: R elbow pain HPI: > 93mo h/o lat epicondylitis with 2 injections, subsequent tear of common extensor.  Failed conservative  Management.  Past Medical History  Diagnosis Date  . GERD (gastroesophageal reflux disease)   . Hypothyroidism   . Environmental allergies   . 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 Wears contact lenses     Past Surgical History  Procedure Laterality Date  . Ablation      Tonsils  . Cleft palate repair      Family History  Problem Relation Age of Onset  . Alzheimer's disease Mother   . Atrial fibrillation Mother   . Bipolar disorder Father   . Diabetes Mellitus II Father    Social History:  reports that she has never smoked. She does not have any smokeless tobacco history on file. She reports that she drinks alcohol. She reports that she does not use illicit drugs.  Allergies:  Allergies  Allergen Reactions  . Adhesive [Tape]   . Latex Rash    Medications Prior to Admission  Medication Sig Dispense Refill  . albuterol (ACCUNEB) 1.25 MG/3ML nebulizer solution Take 1 ampule by nebulization every 6 (six) hours as needed for wheezing (sport induced).    Marland Kitchen. buPROPion (WELLBUTRIN XL) 300 MG 24 hr tablet Take 300 mg by mouth daily. Pt taking 150 and 300 mg alternating    . cetirizine (ZYRTEC) 10 MG tablet Take 10 mg by mouth daily.    Marland Kitchen. esomeprazole (NEXIUM) 40 MG capsule Take 40 mg by mouth daily before breakfast.      . Evening Primrose Oil 1000 MG CAPS Take by mouth.      Karen Becker. Folate-B12-Intrinsic Factor (INTRINSI B12-FOLATE PO) Take by mouth.    . IPRATROPIUM BROMIDE IN Inhale into the lungs.    Marland Kitchen. levothyroxine (SYNTHROID, LEVOTHROID) 112 MCG tablet Take 112 mcg by mouth daily.      . LO LOESTRIN FE 1 MG-10 MCG / 10 MCG tablet     . meloxicam (MOBIC) 15 MG tablet Take 15 mg by mouth daily.    . Omega-3 Fatty Acids (FISH OIL) 1000 MG CAPS Take by mouth.    . Saccharomyces boulardii (FLORASTOR PO) Take by mouth.       No results found for this or any previous visit (from the past 48 hour(s)). No results found.  Review of Systems  All other systems reviewed and are negative.   Blood pressure 116/76, pulse 75, temperature 97.8 F (36.6 C), temperature source Oral, resp. rate 16, height 5' 4.5" (1.638 m), weight 56.473 kg (124 lb 8 oz), last menstrual period 11/01/2014, SpO2 100 %. Physical Exam  Constitutional: She is oriented to person, place, and time. She appears well-developed and well-nourished.  HENT:  Head: Atraumatic.  Eyes: EOM are normal.  Cardiovascular: Intact distal pulses.   Respiratory: Effort normal.  Musculoskeletal:  R elbow pain with resisted wrist ext and TTP over lat epicondyle.  Neurological: She is alert and oriented to person, place, and time.  Skin: Skin is warm and dry.  Psychiatric: She has a normal mood and affect.     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

## 2014-12-02 NOTE — Op Note (Signed)
Procedure(s): Right elbow common extensor repair Procedure Note  Karen MarvelKarla Becker female 49 y.o. 12/02/2014  Procedure(s) and Anesthesia Type:    * Right elbow common extensor debridement and repair - General      Surgeon: Mable ParisHANDLER,Naveen Clardy WILLIAM   Assistants: Damita Lackanielle Lalibert PA-C (Danielle was present and scrubbed throughout the procedure and was essential in positioning, retraction, exposure, and closure)  Anesthesia: General endotracheal anesthesia    Procedure Detail  Right elbow common extensor repair  Estimated Blood Loss:  Minimal         Drains: none  Blood Given: none         Specimens: none        Complications:  * No complications entered in OR log *         Disposition: PACU - hemodynamically stable.         Condition: stable  Tourniquet time less than 30 minutes of 250 mmHg    Procedure:   INDICATIONS FOR SURGERY: [age] show female with a history of chronic lateral epicondylitis with recent extensor tendon rupture with worsening of symptoms. Indicated for surgical treatment to debride the degenerative tendon repair the extensor origin.She understood risks benefits alternatives to procedure and wished to go forward with surgery. All questions were welcomed and answered.   DESCRIPTION OF PROCEDURE: The patient was identified in preoperative  holding area where I personally marked the operative site after  verifying site, side, and procedure with the patient. The patient was taken back  to the operating room where general anesthesia was induced without  Complication. The patient did receive preoperative IV antibiotics. The patient was kept in the supine position. The operative extremity was prepped and draped in standard sterile fashion. A. approximately 4 cm Incision was made obliquely just anterior to the palpable lateral condyle. Dissection was carried down through subcutaneous tissues to the fascia. The common extensor fascia was split longitudinally just  anterior to the midpoint on the lateral condyle and the underlying tear was identified. The anterior superior quadrant of the epicondyle was completely bare with tendon tissue retracted a few millimeters. With careful examination of the origin of the ECRB was noted to be severely degenerated tissue which was completely amorphous and gray with no identifiable tendinous structure. This was felt to be the pathological tissue related to her underlying chronic epicondylitis and was resected in its entirety. The remaining tendon anterior posterior. Structurally intact and healthy. Therefore the bare surface of the epicondyle was run injured down to bleeding bone and a small juggernaut anchor was placed, passing one stitch anterior one stage posterior to bring the tendon down to the prepared condyle. The remaining split in the tendon was repaired with a running 0 Vicryl suture.Skin was then closed with 2-0 Vicryl and Dermabond. The wound is infiltrated with 9 mL half percent Marcaine without epinephrine. A light sterile dressing was applied as well as her Velcro wrist immobilizer. She was then allowed to awaken from anesthesia transferred to stretcher and taken to the recovery room in stable condition.  POSTOPERATIVE PLAN: The patient will be discharged home today with family.  She will follow-up in 7-10 days for wound check. She will begin some gentle active assistive range of motion exercises when comfortable.

## 2014-12-03 ENCOUNTER — Encounter (HOSPITAL_BASED_OUTPATIENT_CLINIC_OR_DEPARTMENT_OTHER): Payer: Self-pay | Admitting: Orthopedic Surgery

## 2015-02-26 ENCOUNTER — Other Ambulatory Visit: Payer: Self-pay

## 2015-02-26 DIAGNOSIS — Z1231 Encounter for screening mammogram for malignant neoplasm of breast: Secondary | ICD-10-CM

## 2015-03-11 ENCOUNTER — Other Ambulatory Visit: Payer: Self-pay | Admitting: Gastroenterology

## 2015-03-11 DIAGNOSIS — R1033 Periumbilical pain: Secondary | ICD-10-CM

## 2015-03-24 ENCOUNTER — Ambulatory Visit (HOSPITAL_COMMUNITY)
Admission: RE | Admit: 2015-03-24 | Discharge: 2015-03-24 | Disposition: A | Discharge status: Home / Self Care | Payer: Managed Care, Other (non HMO) | Source: Ambulatory Visit | Attending: Gastroenterology | Admitting: Gastroenterology

## 2015-03-24 DIAGNOSIS — R1011 Right upper quadrant pain: Secondary | ICD-10-CM | POA: Diagnosis not present

## 2015-03-24 DIAGNOSIS — K7689 Other specified diseases of liver: Secondary | ICD-10-CM | POA: Insufficient documentation

## 2015-03-24 DIAGNOSIS — K219 Gastro-esophageal reflux disease without esophagitis: Secondary | ICD-10-CM | POA: Diagnosis not present

## 2015-03-24 DIAGNOSIS — R1033 Periumbilical pain: Secondary | ICD-10-CM | POA: Insufficient documentation

## 2015-04-14 ENCOUNTER — Ambulatory Visit
Admission: RE | Admit: 2015-04-14 | Discharge: 2015-04-14 | Disposition: A | Discharge status: Home / Self Care | Payer: Managed Care, Other (non HMO) | Source: Ambulatory Visit

## 2015-04-14 DIAGNOSIS — Z1231 Encounter for screening mammogram for malignant neoplasm of breast: Secondary | ICD-10-CM

## 2015-05-28 ENCOUNTER — Other Ambulatory Visit: Payer: Self-pay

## 2016-05-21 ENCOUNTER — Other Ambulatory Visit: Payer: Self-pay | Admitting: Obstetrics and Gynecology

## 2016-05-21 DIAGNOSIS — Z1231 Encounter for screening mammogram for malignant neoplasm of breast: Secondary | ICD-10-CM

## 2016-08-05 ENCOUNTER — Ambulatory Visit
Admission: RE | Admit: 2016-08-05 | Discharge: 2016-08-05 | Disposition: A | Discharge status: Home / Self Care | Payer: Managed Care, Other (non HMO) | Source: Ambulatory Visit | Attending: Obstetrics and Gynecology | Admitting: Obstetrics and Gynecology

## 2016-08-05 DIAGNOSIS — Z1231 Encounter for screening mammogram for malignant neoplasm of breast: Secondary | ICD-10-CM

## 2017-09-07 ENCOUNTER — Other Ambulatory Visit: Payer: Self-pay | Admitting: Obstetrics & Gynecology

## 2017-09-07 DIAGNOSIS — Z1231 Encounter for screening mammogram for malignant neoplasm of breast: Secondary | ICD-10-CM

## 2017-10-13 ENCOUNTER — Ambulatory Visit: Payer: Managed Care, Other (non HMO)

## 2017-11-07 ENCOUNTER — Ambulatory Visit
Admission: RE | Admit: 2017-11-07 | Discharge: 2017-11-07 | Disposition: A | Discharge status: Home / Self Care | Payer: Managed Care, Other (non HMO) | Source: Ambulatory Visit | Attending: Obstetrics & Gynecology | Admitting: Obstetrics & Gynecology

## 2017-11-07 DIAGNOSIS — Z1231 Encounter for screening mammogram for malignant neoplasm of breast: Secondary | ICD-10-CM

## 2017-11-08 ENCOUNTER — Encounter: Payer: Self-pay | Admitting: Obstetrics & Gynecology

## 2017-11-08 ENCOUNTER — Other Ambulatory Visit (HOSPITAL_COMMUNITY)
Admission: RE | Admit: 2017-11-08 | Discharge: 2017-11-08 | Disposition: A | Discharge status: Home / Self Care | Payer: Managed Care, Other (non HMO) | Source: Ambulatory Visit | Attending: Obstetrics & Gynecology | Admitting: Obstetrics & Gynecology

## 2017-11-08 ENCOUNTER — Other Ambulatory Visit: Payer: Self-pay

## 2017-11-08 ENCOUNTER — Ambulatory Visit (INDEPENDENT_AMBULATORY_CARE_PROVIDER_SITE_OTHER): Payer: Managed Care, Other (non HMO) | Admitting: Obstetrics & Gynecology

## 2017-11-08 ENCOUNTER — Telehealth: Payer: Self-pay | Admitting: *Deleted

## 2017-11-08 VITALS — BP 96/58 | HR 68 | Resp 14 | Ht 64.0 in | Wt 133.0 lb

## 2017-11-08 DIAGNOSIS — Z0000 Encounter for general adult medical examination without abnormal findings: Secondary | ICD-10-CM

## 2017-11-08 DIAGNOSIS — E039 Hypothyroidism, unspecified: Secondary | ICD-10-CM | POA: Diagnosis not present

## 2017-11-08 DIAGNOSIS — Z01419 Encounter for gynecological examination (general) (routine) without abnormal findings: Secondary | ICD-10-CM

## 2017-11-08 DIAGNOSIS — Z124 Encounter for screening for malignant neoplasm of cervix: Secondary | ICD-10-CM

## 2017-11-08 DIAGNOSIS — N913 Primary oligomenorrhea: Secondary | ICD-10-CM | POA: Diagnosis not present

## 2017-11-08 MED ORDER — BUPROPION HCL ER (XL) 150 MG PO TB24: 150.0000 mg | ORAL_TABLET | Freq: Every day | ORAL | 4 refills | Status: DC

## 2017-11-08 MED ORDER — HYDROCHLOROTHIAZIDE 25 MG PO TABS: 25.0000 mg | ORAL_TABLET | Freq: Every day | ORAL | 0 refills | Status: DC

## 2017-11-08 MED ORDER — SYNTHROID 112 MCG PO TABS: 112.0000 ug | ORAL_TABLET | Freq: Every day | ORAL | 4 refills | Status: DC

## 2017-11-08 MED ORDER — NORETHIN-ETH ESTRAD-FE BIPHAS 1 MG-10 MCG / 10 MCG PO TABS: 1.0000 | ORAL_TABLET | Freq: Every day | ORAL | 4 refills | Status: DC

## 2017-11-08 NOTE — Progress Notes (Addendum)
52 y.o. G0P0000 MarriedCaucasianF here for new patient annual exam.  Doing well.  On continuous OCPs so skips cycles.  Not aware of every having HPV testing in the past.  Adamantly refuses to have this done.  D/w pt current guidelines and recommendations.  States she's never had another sexual partner and neither has her partner so feels this is unnecessary.  Advised guidelines again.  Will not obtained HR HPV due to pt's desires but she is clearly aware this is not in accordance with Pap guidelines by ACOG and ASCCP.  Patient's last menstrual period was 10/19/2015 (approximate).          Sexually active: Yes.    The current method of family planning is OCP (estrogen/progesterone).    Exercising: Yes.    Aerobics Smoker:  no  Health Maintenance: Pap:  2017 Normal  History of abnormal Pap:  no MMG:  11/07/17 BIRADS1:neg  Colonoscopy:  04/2016 f/u 5 years.  Dr. Loreta Ave. BMD:   2000 TDaP:  unsure Pneumonia vaccine(s):  n/a Shingrix:   n/a Hep C testing: n/a Screening Labs: Will come back fasting   reports that  has never smoked. she has never used smokeless tobacco. She reports that she drinks about 2.4 oz of alcohol per week. She reports that she does not use drugs.  Past Medical History:  Diagnosis Date  . 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 Environmental allergies   . GERD (gastroesophageal reflux disease)   . Hypothyroidism   . Wears contact lenses     Past Surgical History:  Procedure Laterality Date  . ABLATION     Tonsils  . CLEFT PALATE REPAIR    . SEPTOPLASTY  11/2014  . TENDON RECONSTRUCTION Right 12/02/2014   Procedure: Right elbow common extensor repair;  Surgeon: Mable Paris, MD;  Location: [facility];  Service: Orthopedics;  Laterality: Right;  Right elbow common extensor repair    Current Outpatient Medications  Medication Sig Dispense Refill  . albuterol (ACCUNEB) 1.25 MG/3ML nebulizer solution Take 1 ampule by nebulization every 6 (six) hours as  needed for wheezing (sport induced).    Marland Kitchen aspirin 81 MG tablet Take 81 mg by mouth daily.    . betamethasone dipropionate 0.05 % lotion 1 APPLICATION APPLY ON THE SKIN DAILY TO TWICE DAILY AS NEEDED FOR SCALP RASH AND ITCHING  3  . buPROPion (WELLBUTRIN XL) 150 MG 24 hr tablet Take 150 mg by mouth daily.  5  . cetirizine (ZYRTEC) 10 MG tablet Take 10 mg by mouth daily.    . diclofenac (FLECTOR) 1.3 % PTCH Flector 1.3 % transdermal 12 hour patch    . Evening Primrose Oil 1000 MG CAPS Take by mouth.      . hydrochlorothiazide (HYDRODIURIL) 25 MG tablet Take 1 tablet by mouth daily.    . IPRATROPIUM BROMIDE IN Inhale into the lungs.    Marland Kitchen levothyroxine (SYNTHROID, LEVOTHROID) 112 MCG tablet Take 112 mcg by mouth daily.      . Norethindrone-Ethinyl Estradiol-Fe Biphas (LO LOESTRIN FE) 1 MG-10 MCG / 10 MCG tablet Take 1 tablet by mouth daily.    . Omega-3 Fatty Acids (FISH OIL) 1000 MG CAPS Take by mouth.    . Saccharomyces boulardii (FLORASTOR PO) Take by mouth.    . TURMERIC PO Take by mouth daily.    Marland Kitchen ZALEPLON PO Take by mouth daily as needed.     No current facility-administered medications for this visit.  Family History  Problem Relation Age of Onset  . Alzheimer's disease Mother   . Atrial fibrillation Mother   . Bipolar disorder Father   . Diabetes Mellitus II Father   . Breast cancer Maternal Grandmother 85    ROS:  Pertinent items are noted in HPI.  Otherwise, a comprehensive ROS was negative.  Exam:   BP (!) 96/58 (BP Location: Right Arm, Patient Position: Sitting, Cuff Size: Normal)   Pulse 68   Resp 14   Ht 5\' 4"  (1.626 m)   Wt 133 lb (60.3 kg)   LMP 10/19/2015 (Approximate)   BMI 22.83 kg/m     Height: 5\' 4"  (162.6 cm)  Ht Readings from Last 3 Encounters:  11/08/17 5\' 4"  (1.626 m)  12/02/14 5' 4.5" (1.638 m)  01/15/14 5\' 4"  (1.626 m)    General appearance: alert, cooperative and appears stated age Head: Normocephalic, without obvious abnormality,  atraumatic Neck: no adenopathy, supple, symmetrical, trachea midline and thyroid normal to inspection and palpation Lungs: clear to auscultation bilaterally Breasts: normal appearance, no masses or tenderness Heart: regular rate and rhythm Abdomen: soft, non-tender; bowel sounds normal; no masses,  no organomegaly Extremities: extremities normal, atraumatic, no cyanosis or edema Skin: Skin color, texture, turgor normal. No rashes or lesions Lymph nodes: Cervical, supraclavicular, and axillary nodes normal. No abnormal inguinal nodes palpated Neurologic: Grossly normal   Pelvic: External genitalia:  no lesions              Urethra:  normal appearing urethra with no masses, tenderness or lesions              Bartholins and Skenes: normal                 Vagina: normal appearing vagina with normal color and discharge, no lesions              Cervix: no lesions              Pap taken: Yes.   Bimanual Exam:  Uterus:  normal size, contour, position, consistency, mobility, non-tender              Adnexa: normal adnexa and no mass, fullness, tenderness               Rectovaginal: Confirms               Anus:  normal sphincter tone, no lesions  Chaperone was present for exam.  A:  Well Woman with normal exam On OCPs Family hx of Alzheimer's in her mother Hypothyroidism H/O breast cancer in MGM, age 52  P:   Mammogram guidelines reviewed.  3D MMG dicussed. pap smear obtained today.  No HR HPV obtained due to pt's refusal to have this obtained.  Will plan yearly pap smears.  Pt is clearly aware this is not in accordance with guidelines Return for fasting lab work:  TSH, Lipids, FSH, CMP, CBC RF for Wellbutrin XL 150mg  daily.   RF for Synthroid, brand only, 112mcg daily. HCTZ 25mg  prn.  Uses one or two a month.  #30/0RF RF for Loloestrin daily for one year. return annually or prn

## 2017-11-08 NOTE — Telephone Encounter (Signed)
[facility] spoke with Alexa, Pharmacist. Advised per Dr. Hyacinth MeekerMiller to cancel hydrochlorothiazide prescription. Alexa advised had not yet received prescription, but would make a note to fill wellbutrin, OCP, and synthroid and cancel hydrochlorothiazide prescription. Advised to return call to office if any issues.   Will close encounter.

## 2017-11-09 LAB — CYTOLOGY - PAP: DIAGNOSIS: NEGATIVE

## 2017-11-11 ENCOUNTER — Encounter: Payer: Self-pay | Admitting: Obstetrics & Gynecology

## 2017-11-11 DIAGNOSIS — E039 Hypothyroidism, unspecified: Secondary | ICD-10-CM | POA: Insufficient documentation

## 2017-11-18 ENCOUNTER — Encounter: Payer: Self-pay | Admitting: Obstetrics & Gynecology

## 2017-11-18 ENCOUNTER — Telehealth: Payer: Self-pay | Admitting: Obstetrics & Gynecology

## 2017-11-18 NOTE — Telephone Encounter (Signed)
Patient sent the following correspondence through MyChart. Routing to triage to assist patient with request.  ----- Message from Mychart, Generic sent at 11/18/2017 1:09 PM EST -----    Pap is clear, would someone have indicated if i had a yeast infection if that had been detected as well?

## 2017-11-18 NOTE — Telephone Encounter (Signed)
Left message to call Kaitlyn at [phone]. 

## 2017-11-25 ENCOUNTER — Other Ambulatory Visit (INDEPENDENT_AMBULATORY_CARE_PROVIDER_SITE_OTHER): Payer: Managed Care, Other (non HMO)

## 2017-11-25 DIAGNOSIS — N913 Primary oligomenorrhea: Secondary | ICD-10-CM

## 2017-11-25 DIAGNOSIS — Z0000 Encounter for general adult medical examination without abnormal findings: Secondary | ICD-10-CM

## 2017-11-25 NOTE — Telephone Encounter (Signed)
Spoke with patient while in office. Advised patient pap is able to pick up on yeast if present. Advised we do have other specific testing for yeast that we do in the office. Advised pap was normal. No yeast seen. Patient reports no problems or concerns at this time.  Routing to provider for final review. Patient agreeable to disposition. Will close encounter.

## 2017-11-26 LAB — COMPREHENSIVE METABOLIC PANEL
ALT: 13 IU/L (ref 0–32)
AST: 21 IU/L (ref 0–40)
Albumin/Globulin Ratio: 1.7 (ref 1.2–2.2)
Albumin: 4.2 g/dL (ref 3.5–5.5)
Alkaline Phosphatase: 40 IU/L (ref 39–117)
BUN/Creatinine Ratio: 12 (ref 9–23)
BUN: 13 mg/dL (ref 6–24)
Bilirubin Total: 1.3 mg/dL — ABNORMAL HIGH (ref 0.0–1.2)
CALCIUM: 9.5 mg/dL (ref 8.7–10.2)
CO2: 22 mmol/L (ref 20–29)
Chloride: 102 mmol/L (ref 96–106)
Creatinine, Ser: 1.09 mg/dL — ABNORMAL HIGH (ref 0.57–1.00)
GFR, EST AFRICAN AMERICAN: 69 mL/min/{1.73_m2} (ref >59)
GFR, EST NON AFRICAN AMERICAN: 60 mL/min/{1.73_m2} (ref >59)
GLUCOSE: 78 mg/dL (ref 65–99)
Globulin, Total: 2.5 g/dL (ref 1.5–4.5)
POTASSIUM: 4.5 mmol/L (ref 3.5–5.2)
Sodium: 138 mmol/L (ref 134–144)
TOTAL PROTEIN: 6.7 g/dL (ref 6.0–8.5)

## 2017-11-26 LAB — CBC
HEMOGLOBIN: 13.4 g/dL (ref 11.1–15.9)
Hematocrit: 41.5 % (ref 34.0–46.6)
MCH: 34 pg — AB (ref 26.6–33.0)
MCHC: 32.3 g/dL (ref 31.5–35.7)
MCV: 105 fL — ABNORMAL HIGH (ref 79–97)
Platelets: 310 10*3/uL (ref 150–379)
RBC: 3.94 x10E6/uL (ref 3.77–5.28)
RDW: 12.5 % (ref 12.3–15.4)
WBC: 4.4 10*3/uL (ref 3.4–10.8)

## 2017-11-26 LAB — LIPID PANEL
CHOL/HDL RATIO: 2.5 ratio (ref 0.0–4.4)
Cholesterol, Total: 160 mg/dL (ref 100–199)
HDL: 64 mg/dL (ref >39)
LDL CALC: 80 mg/dL (ref 0–99)
Triglycerides: 81 mg/dL (ref 0–149)
VLDL Cholesterol Cal: 16 mg/dL (ref 5–40)

## 2017-11-26 LAB — VITAMIN D 25 HYDROXY (VIT D DEFICIENCY, FRACTURES): VIT D 25 HYDROXY: 37.1 ng/mL (ref 30.0–100.0)

## 2017-11-26 LAB — TSH: TSH: 4.89 u[IU]/mL — ABNORMAL HIGH (ref 0.450–4.500)

## 2017-11-26 LAB — FOLLICLE STIMULATING HORMONE: FSH: 1.1 m[IU]/mL

## 2017-11-29 LAB — SPECIMEN STATUS REPORT

## 2017-11-29 LAB — T4, FREE: FREE T4: 1.18 ng/dL (ref 0.82–1.77)

## 2017-12-01 ENCOUNTER — Encounter: Payer: Self-pay | Admitting: Obstetrics & Gynecology

## 2017-12-01 ENCOUNTER — Telehealth: Payer: Self-pay | Admitting: Obstetrics & Gynecology

## 2017-12-01 NOTE — Telephone Encounter (Signed)
1)  She needs to stop her estrogen containing OCPs now.  She did not mention anything about headaches to me when she was here.  There is nothing in Epic about this and I reviewed Dr. Marlis EdelsonSethi's note when she was here as well.  Estrogen containing pills do increase the risk of stroke.  Please call pharmacy and cancel her OCP rx.  2)  She can take POPs and it is ok to transition to this.  If desired, ok to prescribe.  Also, may want to consider IUD.    3)  I do not prescribe the headache medication she is requesting.  I think she needs to be followed by neurologist/headache specialist for her headaches.  She needs to be on preventative medication and not just use medications when she has headaches.  She may not want to see a different neurologist.

## 2017-12-01 NOTE — Telephone Encounter (Signed)
Left message to call Kaitlyn at [phone]. 

## 2017-12-01 NOTE — Telephone Encounter (Signed)
Routing to Dr. Miller

## 2017-12-01 NOTE — Telephone Encounter (Signed)
Patient sent the following correspondence through MyChart. Routing to triage to assist patient with request.  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 I go on vacation, i finish exams in college and then i have tunnel vision or auras that block parts of my sight, i then get pain across bridge of nose and then a knock out, dark room, throw up migraine.  Over the years my doctors have given me Midrin, Duridin, most recently Isomethept-Dichloralop-Acetamin. I keep in in my car and purse in case i get one. These happen once or twice a year or they skip a few years but the pills reduce pain to really bad headache instead of debilitation. my current pills have expired. Can you presribe 30 for me that i can keep in various locations if something comes up?   I did want to ask to this end... Karen Becker heard that if people who have aura migraines and take birth control there is a higher risk of stroke. Thoughts. I am taking baby aspirin daily.

## 2017-12-01 NOTE — Telephone Encounter (Deleted)
Medication request 12/01/2017 2:39 PM    To: Salvatore MarvelKarla Jobst    From: Ginny ForthSprague, Kaitlyn E, RN    Created: 12/01/2017 2:39 PM     Paula ComptonKarla,  Dr.Miller has reviewed your MyChart message and would like you to know that you need to stop your estrogen containing OCPs at this time.Estrogen containing pills do increase the risk of stroke. You can take progesterone only pills and it is ok to transition to this at this time or you may want to consider an IUD. Dr.Miller does not prescribe the headache medication you are requesting. She thinks you need to be followed by a neurologist/headache specialist for your headaches.She recommends that you be on preventative medication and not just use medications when you have headaches.We can refer you to a specialist or if you would like to return to see Dr.Sethi that is okay as well. I will attempt to reach you via telephone to discuss this further. Please contact the office at your convenience with any questions or concerns.  Sincerely,  Nolen MuKaitlyn Sprague, RN     MyChart message sent as well to patient.

## 2017-12-01 NOTE — Telephone Encounter (Signed)
[facility] Was last seen in 2015. Referral back to Dr.Sethi for evaluation and medication refill?

## 2017-12-01 NOTE — Telephone Encounter (Signed)
Patient sent the following correspondence through MyChart. Routing to triage to assist patient with request.  ----- Message from Mychart, Generic sent at 12/01/2017 4:32 PM EST -----    I appreciate the reply but think that going to see a neurologist for migraines i have had a total of 10 times in 25 years is overkill.  Other physicians have been aware of this and have never expressed any level of concern. Also, i had hoped Dr. Hyacinth MeekerMiller would serve as my primary care doctor as i don't use one and am not sure why basic migraine meds that i used 6 of, not even once a year isn't something she could prescribe.   I don't take BCP for Birth Control, I take them for ovarian cysts. I will not be getting an IUD. i would need more info on progesterone pills and what levels but again, not sure that would address the cysts.   ----- Message -----  From: Nurse Richarda OverlieKaitlyn E S  Sent: 12/01/2017 2:39 PM EST  To: Karen Becker  Subject: Medication request  Karen Becker,    Dr.Miller has reviewed your MyChart message and would like you to know that you need to stop your estrogen containing OCPs at this time. Estrogen containing pills do increase the risk of stroke. You can take progesterone only pills and it is ok to transition to this at this time or you may want to consider an IUD. Dr.Miller does not prescribe the headache medication you are requesting. She thinks you need to be followed by a neurologist/headache specialist for your headaches. She recommends that you be on preventative medication and not just use medications when you have headaches. We can refer you to a specialist or if you would like to return to see Dr.Sethi that is okay as well. I will attempt to reach you via telephone to discuss this further. Please contact the office at your convenience with any questions or concerns.    Sincerely,    Nolen MuKaitlyn Sprague, RN

## 2017-12-01 NOTE — Telephone Encounter (Signed)
Patient is returning a call to YelvingtonKaitlyn. Patient asked if you could please  leave details on her voicemail or message her through MyChart with details.

## 2017-12-02 ENCOUNTER — Encounter: Payer: Self-pay | Admitting: Obstetrics & Gynecology

## 2017-12-02 NOTE — Telephone Encounter (Signed)
My chart communication sent back to pt.  I do feel if she has further concerns, OV would be best.  Thanks.

## 2017-12-06 ENCOUNTER — Encounter: Payer: Self-pay | Admitting: Family Medicine

## 2017-12-06 ENCOUNTER — Other Ambulatory Visit: Payer: Self-pay

## 2017-12-06 ENCOUNTER — Ambulatory Visit (INDEPENDENT_AMBULATORY_CARE_PROVIDER_SITE_OTHER): Payer: Managed Care, Other (non HMO) | Admitting: Family Medicine

## 2017-12-06 VITALS — BP 112/71 | HR 79 | Temp 99.0°F | Resp 18 | Ht 64.0 in | Wt 129.5 lb

## 2017-12-06 DIAGNOSIS — E039 Hypothyroidism, unspecified: Secondary | ICD-10-CM | POA: Diagnosis not present

## 2017-12-06 DIAGNOSIS — D7589 Other specified diseases of blood and blood-forming organs: Secondary | ICD-10-CM | POA: Diagnosis not present

## 2017-12-06 DIAGNOSIS — J029 Acute pharyngitis, unspecified: Secondary | ICD-10-CM | POA: Diagnosis not present

## 2017-12-06 LAB — POCT RAPID STREP A (OFFICE): RAPID STREP A SCREEN: NEGATIVE

## 2017-12-06 MED ORDER — AMOXICILLIN-POT CLAVULANATE 875-125 MG PO TABS: 1.0000 | ORAL_TABLET | Freq: Two times a day (BID) | ORAL | 0 refills | Status: DC

## 2017-12-06 NOTE — Patient Instructions (Addendum)
It was a pleasure to meet you today.  We will send the throat culture and if positive start the antibiotic provided.  If you are worsening before we receive the culture, you may start the antibiotic.   Rest, hydrate.  + flonase, mucinex (DM if cough), nettie pot or nasal saline.   Please help [place] help you:  We are honored you have chosen [facility] for your Primary Care home. Below you will find basic instructions that you may need to access in the future. Please help [place] help you by reading the instructions, which cover many of the frequent questions we experience.   Prescription refills and request:  -In order to allow more efficient response time, please call your pharmacy for all refills. They will forward the request electronically to [place]. This allows for the quickest possible response. Request left on a nurse line can take longer to refill, since these are checked as time allows between office patients and other phone calls.  - refill request can take up to 3-5 working days to complete.  - If request is sent electronically and request is appropiate, it is usually completed in 1-2 business days.  - all patients will need to be seen routinely for all chronic medical conditions requiring prescription medications (see follow-up below). If you are overdue for follow up on your condition, you will be asked to make an appointment and we will call in enough medication to cover you until your appointment (up to 30 days).  - all controlled substances will require a face to face visit to request/refill.  - if you desire your prescriptions to go through a new pharmacy, and have an active script at original pharmacy, you will need to call your pharmacy and have scripts transferred to new pharmacy. This is completed between the pharmacy locations and not by your provider.    Results: If any images or labs were ordered, it can take up to 1 week to get results depending on the test ordered and the  lab/facility running and resulting the test. - Normal or stable results, which do not need further discussion, may be released to your mychart immediately with attached note to you. A call may not be generated for normal results. Please make certain to sign up for mychart. If you have questions on how to activate your mychart you can call the front office.  - If your results need further discussion, our office will attempt to contact you via phone, and if unable to reach you after 2 attempts, we will release your abnormal result to your mychart with instructions.  - All results will be automatically released in mychart after 1 week.  - Your provider will provide you with explanation and instruction on all relevant material in your results. Please keep in mind, results and labs may appear confusing or abnormal to the untrained eye, but it does not mean they are actually abnormal for you personally. If you have any questions about your results that are not covered, or you desire more detailed explanation than what was provided, you should make an appointment with your provider to do so.   Our office handles many outgoing and incoming calls daily. If we have not contacted you within 1 week about your results, please check your mychart to see if there is a message first and if not, then contact our office.  In helping with this matter, you help decrease call volume, and therefore allow [place] to be able to  respond to patients needs more efficiently.   Acute office visits (sick visit):  An acute visit is intended for a new problem and are scheduled in shorter time slots to allow schedule openings for patients with new problems. This is the appropriate visit to discuss a new problem. In order to provide you with excellent quality medical care with proper time for you to explain your problem, have an exam and receive treatment with instructions, these appointments should be limited to one new problem per visit. If you  experience a new problem, in which you desire to be addressed, please make an acute office visit, we save openings on the schedule to accommodate you. Please do not save your new problem for any other type of visit, let us take care of it properly and quickly for you.   Follow up visits:  Depending on your condition(s) your provider will need to see you routinely in order to provide you with quality care and prescribe medication(s). Most chronic conditions (Example: hypertension, Diabetes, depression/anxiety... etc), require visits a couple times a year. Your provider will instruct you on proper follow up for your personal medical conditions and history. Please make certain to make follow up appointments for your condition as instructed. Failing to do so could result in lapse in your medication treatment/refills. If you request a refill, and are overdue to be seen on a condition, we will always provide you with a 30 day script (once) to allow you time to schedule.    Medicare wellness (well visit): - we have a wonderful Nurse Selena Batten(Kim), that will meet with you and provide you will yearly medicare wellness visits. These visits should occur yearly (can not be scheduled less than 1 calendar year apart) and cover preventive health, immunizations, advance directives and screenings you are entitled to yearly through your medicare benefits. Do not miss out on your entitled benefits, this is when medicare will pay for these benefits to be ordered for you.  These are strongly encouraged by your provider and is the appropriate type of visit to make certain you are up to date with all preventive health benefits. If you have not had your medicare wellness exam in the last 12 months, please make certain to schedule one by calling the office and schedule your medicare wellness with Selena BattenKim as soon as possible.   Yearly physical (well visit):  - Adults are recommended to be seen yearly for physicals. Check with your insurance  and date of your last physical, most insurances require one calendar year between physicals. Physicals include all preventive health topics, screenings, medical exam and labs that are appropriate for gender/age and history. You may have fasting labs needed at this visit. This is a well visit (not a sick visit), new problems should not be covered during this visit (see acute visit).  - Pediatric patients are seen more frequently when they are younger. Your provider will advise you on well child visit timing that is appropriate for your their age. - This is not a medicare wellness visit. Medicare wellness exams do not have an exam portion to the visit. Some medicare companies allow for a physical, some do not allow a yearly physical. If your medicare allows a yearly physical you can schedule the medicare wellness with our nurse Selena BattenKim and have your physical with your provider after, on the same day. Please check with insurance for your full benefits.   Late Policy/No Shows:  - all new patients should arrive 15-30 minutes earlier  than appointment to allow [place] time  to  obtain all personal demographics,  insurance information and for you to complete office paperwork. - All established patients should arrive 10-15 minutes earlier than appointment time to update all information and be checked in .  - In our best efforts to run on time, if you are late for your appointment you will be asked to either reschedule or if able, we will work you back into the schedule. There will be a wait time to work you back in the schedule,  depending on availability.  - If you are unable to make it to your appointment as scheduled, please call 24 hours ahead of time to allow [place] to fill the time slot with someone else who needs to be seen. If you do not cancel your appointment ahead of time, you may be charged a no show fee.

## 2017-12-06 NOTE — Progress Notes (Signed)
Patient ID: Karen Becker, female  DOB: 1966-01-13, 52 y.o.   MRN: [id number] Patient Care Team    Relationship Specialty Notifications Start End  Natalia LeatherwoodKuneff, Lulu Hirschmann A, DO PCP - General Family Medicine  12/06/17   Jake BatheSkains, Mark C, MD Consulting Physician Cardiology  12/06/17   Charna ElizabethMann, Jyothi, MD Consulting Physician Gastroenterology  12/06/17   Micki RileySethi, Pramod S, MD Consulting Physician Neurology  12/06/17    Comment: Migraine  Jerene BearsMiller, Mary S, MD Consulting Physician Gynecology  12/06/17     Chief Complaint  Patient presents with  . Establish Care  . Sore Throat    fatigue, x 3 days    Subjective:  Karen MarvelKarla Becker is a 52 y.o.  female present for new patient establishment with acute complaint- work in same day new pt.  All past medical history, surgical history, allergies, family history, immunizations, medications and social history were obtained and entered in the electronic medical record today. All recent labs, ED visits and hospitalizations within the last year were reviewed. All current medications and chronic illnesses are managed by her GYN, in which she states she gets her physical yearly with labs though them. Reviewed labs collected 11/25/2017 at GYN. Abnormal bili 1.3, normal LFT suggest possible cause from fasting. MCV/macrocytosis also present at 105. Pt endorses drinking wine nightly and thinks that is the cause. She has stopped drinking all alcohol to see if improved. She states her GYN is following this abnormal lab.  Pt has a thyroid condition which was very mildly elevated at 4.890 with normal T4, medications were altered by GYN.  She is prescribed Wellbutrin, no reported history of depression/anxiety or reason for use. Managed by GYN.  No prior records available for review.   Sore throat:  Pt reports sore throat of 3 days duration. When she awoke this morning she felt extremely fatigued and was concerned she may have strep throat. She endorses mild sinus pressure intermittently, in  different locations, over the last 3 days since returning from a brief trip. She reports she had strep throat in college and she was not aware she had it until she passed out. She denies fever, chills, nausea, vomit or rash. She endorses a very mild day of loose stool. She has not tried anything for discomfort.  Depression screen [facility] 12/06/2017  Decreased Interest 0  Down, Depressed, Hopeless 0  PHQ - 2 Score 0  Altered sleeping 0  Tired, decreased energy 0  Change in appetite 0  Feeling bad or failure about yourself  0  Trouble concentrating 0  Moving slowly or fidgety/restless 0  Suicidal thoughts 0  PHQ-9 Score 0   GAD 7 : Generalized Anxiety Score 12/06/2017  Nervous, Anxious, on Edge 0  Control/stop worrying 0  Worry too much - different things 0  Trouble relaxing 0  Restless 0  Easily annoyed or irritable 0  Afraid - awful might happen 0  Total GAD 7 Score 0    Current Exercise Habits: Structured exercise class, Time (Minutes): 60, Frequency (Times/Week): 6, Weekly Exercise (Minutes/Week): 360, Intensity: Moderate Exercise limited by: None identified No flowsheet data found.   Immunization History  Administered Date(s) Administered  . Influenza,inj,quad, With Preservative 08/03/2017  . Tdap 07/18/2016    No exam data present  Past Medical History:  Diagnosis Date  . Allergy   . Asthma   . 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 Environmental allergies   . Frequent headaches   . GERD (gastroesophageal  reflux disease)   . Hypothyroidism   . Migraines    with aura  . Palpitations    established with cardiology, resolved. felt to be possibly anxiety related.   . Wears contact lenses    Allergies  Allergen Reactions  . Adhesive [Tape]   . Latex Rash   Past Surgical History:  Procedure Laterality Date  . ABLATION  2015   Tonsils  . CLEFT PALATE REPAIR    . COLONOSCOPY     Dr. Loreta Ave, possible colon polyps. pt not certain. Seen for constipation per pt, but has had  colonoscopyx2 since age 89   . SEPTOPLASTY  11/2014  . TENDON RECONSTRUCTION Right 12/02/2014   Procedure: Right elbow common extensor repair;  Surgeon: Mable Paris, MD;  Location: [facility];  Service: Orthopedics;  Laterality: Right;  Right elbow common extensor repair   Family History  Problem Relation Age of Onset  . Alzheimer's disease Mother   . Atrial fibrillation Mother   . Stroke Mother   . Bipolar disorder Father   . Diabetes Mellitus II Father   . Depression Father   . Drug abuse Father   . Heart disease Father   . Hyperlipidemia Father   . Breast cancer Maternal Grandmother 23   Social History   Socioeconomic History  . Marital status: Married    Spouse name: bill  . Number of children: 0  . Years of education: college  . Highest education level: Not on file  Social Needs  . Financial resource strain: Not on file  . Food insecurity - worry: Not on file  . Food insecurity - inability: Not on file  . Transportation needs - medical: Not on file  . Transportation needs - non-medical: Not on file  Occupational History  . Occupation: IT Investment banker, corporate: [employer]  Tobacco Use  . Smoking status: Never Smoker  . Smokeless tobacco: Never Used  Substance and Sexual Activity  . Alcohol use: Yes    Alcohol/week: 2.4 oz    Types: 4 Glasses of wine per week  . Drug use: No  . Sexual activity: Yes    Partners: Male    Birth control/protection: None    Comment: Married  Other Topics Concern  . 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    G0P0   Exercises routinely.    Takes a daily vitamin.    Uses herbal remedies.    Drinks caffeine.    Smoke alarm in the home.    Wears seatbelt.    Feels safe in her relationships.   Allergies as of 12/06/2017      Reactions   Adhesive [tape]    Latex Rash      Medication List        Accurate as of 12/06/17  6:01 PM. Always use your most recent  med list.          albuterol 1.25 MG/3ML nebulizer solution Commonly known as:  ACCUNEB Take 1 ampule by nebulization every 6 (six) hours as needed for wheezing (sport induced).   amoxicillin-clavulanate 875-125 MG tablet Commonly known as:  AUGMENTIN Take 1 tablet by mouth 2 (two) times daily.   aspirin 81 MG tablet Take 81 mg by mouth daily.   betamethasone dipropionate 0.05 % lotion 1 APPLICATION APPLY ON THE SKIN DAILY TO TWICE DAILY AS NEEDED FOR SCALP RASH AND ITCHING   buPROPion 150 MG 24 hr  tablet Commonly known as:  WELLBUTRIN XL Take 1 tablet (150 mg total) by mouth daily.   cetirizine 10 MG tablet Commonly known as:  ZYRTEC Take 10 mg by mouth daily.   Evening Primrose Oil 1000 MG Caps Take by mouth.   Fish Oil 1000 MG Caps Take by mouth.   FLORASTOR PO Take by mouth.   IPRATROPIUM BROMIDE IN Inhale into the lungs.   SYNTHROID 112 MCG tablet Generic drug:  levothyroxine Take 1 tablet (112 mcg total) by mouth daily. One po qd   TURMERIC PO Take by mouth daily.       All past medical history, surgical history, allergies, family history, immunizations andmedications were updated in the EMR today and reviewed under the history and medication portions of their EMR.    Recent Results (from the past 2160 hour(s))  Cytology - PAP     Status: None   Collection Time: 11/08/17 12:00 AM  Result Value Ref Range   Adequacy      Satisfactory for evaluation  endocervical/transformation zone component PRESENT.   Diagnosis      NEGATIVE FOR INTRAEPITHELIAL LESIONS OR MALIGNANCY.   Material Submitted CervicoVaginal Pap [ThinPrep Imaged]    CYTOLOGY - PAP PAP RESULT   Follicle stimulating hormone     Status: None   Collection Time: 11/25/17  8:40 AM  Result Value Ref Range   FSH 1.1 mIU/mL    Comment:                     Adult Female:                       Follicular phase      3.5 -  12.5                       Ovulation phase       4.7 -  21.5                        Luteal phase          1.7 -   7.7                       Postmenopausal       25.8 - 134.8   VITAMIN D 25 Hydroxy (Vit-D Deficiency, Fractures)     Status: None   Collection Time: 11/25/17  8:40 AM  Result Value Ref Range   Vit D, 25-Hydroxy 37.1 30.0 - 100.0 ng/mL    Comment: Vitamin D deficiency has been defined by the Institute of Medicine and an Endocrine Society practice guideline as a level of serum 25-OH vitamin D less than 20 ng/mL (1,2). The Endocrine Society went on to further define vitamin D insufficiency as a level between 21 and 29 ng/mL (2). 1. IOM (Institute of Medicine). 2010. Dietary reference    intakes for calcium and D. [place]: The    [employer]. 2. Holick MF, Binkley Rothschild, Bischoff-Ferrari HA, et al.    Evaluation, treatment, and prevention of vitamin D    deficiency: an Endocrine Society clinical practice    guideline. JCEM. 2011 Jul; 96(7):1911-30.   TSH     Status: Abnormal   Collection Time: 11/25/17  8:40 AM  Result Value Ref Range   TSH 4.890 (H) 0.450 - 4.500 uIU/mL  Lipid panel     Status: None  Collection Time: 11/25/17  8:40 AM  Result Value Ref Range   Cholesterol, Total 160 100 - 199 mg/dL   Triglycerides 81 0 - 149 mg/dL   HDL 64 >54 mg/dL   VLDL Cholesterol Cal 16 5 - 40 mg/dL   LDL Calculated 80 0 - 99 mg/dL   Chol/HDL Ratio 2.5 0.0 - 4.4 ratio    Comment:                                   T. Chol/HDL Ratio                                             Men  Women                               1/2 Avg.Risk  3.4    3.3                                   Avg.Risk  5.0    4.4                                2X Avg.Risk  9.6    7.1                                3X Avg.Risk 23.4   11.0   Comprehensive metabolic panel     Status: Abnormal   Collection Time: 11/25/17  8:40 AM  Result Value Ref Range   Glucose 78 65 - 99 mg/dL   BUN 13 6 - 24 mg/dL   Creatinine, Ser 0.98 (H) 0.57 - 1.00 mg/dL   GFR calc non Af Amer 60  >59 mL/min/1.73   GFR calc Af Amer 69 >59 mL/min/1.73   BUN/Creatinine Ratio 12 9 - 23   Sodium 138 134 - 144 mmol/L   Potassium 4.5 3.5 - 5.2 mmol/L   Chloride 102 96 - 106 mmol/L   CO2 22 20 - 29 mmol/L   Calcium 9.5 8.7 - 10.2 mg/dL   Total Protein 6.7 6.0 - 8.5 g/dL   Albumin 4.2 3.5 - 5.5 g/dL   Globulin, Total 2.5 1.5 - 4.5 g/dL   Albumin/Globulin Ratio 1.7 1.2 - 2.2   Bilirubin Total 1.3 (H) 0.0 - 1.2 mg/dL   Alkaline Phosphatase 40 39 - 117 IU/L   AST 21 0 - 40 IU/L   ALT 13 0 - 32 IU/L  CBC     Status: Abnormal   Collection Time: 11/25/17  8:40 AM  Result Value Ref Range   WBC 4.4 3.4 - 10.8 x10E3/uL   RBC 3.94 3.77 - 5.28 x10E6/uL   Hemoglobin 13.4 11.1 - 15.9 g/dL   Hematocrit 11.9 14.7 - 46.6 %   MCV 105 (H) 79 - 97 fL   MCH 34.0 (H) 26.6 - 33.0 pg   MCHC 32.3 31.5 - 35.7 g/dL   RDW 82.9 56.2 - 13.0 %   Platelets 310 150 - 379 x10E3/uL  T4, free     Status: None   Collection Time:  11/25/17  8:40 AM  Result Value Ref Range   Free T4 1.18 0.82 - 1.77 ng/dL  Specimen status report     Status: None   Collection Time: 11/25/17  8:40 AM  Result Value Ref Range   specimen status report Comment     Comment: Written Authorization Written Authorization Written Authorization Received. Authorization received from CASEY HERNANDEZ 11-29-2017 Logged by Harl Bowie   POCT rapid strep A     Status: Normal   Collection Time: 12/06/17  4:16 PM  Result Value Ref Range   Rapid Strep A Screen Negative Negative    No results found.   ROS: 14 pt review of systems performed and negative (unless mentioned in an HPI)  Objective: BP 112/71 (BP Location: Right Arm, Patient Position: Sitting, Cuff Size: Normal)   Pulse 79   Temp 99 F (37.2 C)   Resp 18   Ht 5\' 4"  (1.626 m)   Wt 129 lb 8 oz (58.7 kg)   SpO2 99%   BMI 22.23 kg/m  Gen: Afebrile. No acute distress. Nontoxic in appearance, well-developed, well-nourished, physically fit  caucasian female.  HENT: AT. [place].  Bilateral TM visualized and normal in appearance, normal external auditory canal. MMM, no oral lesions, adequate dentition. Bilateral nares with mild erythema, mild swelling, no drainage. Throat without erythema, ulcerations or exudates. no Cough on exam, no hoarseness on exam. Eyes:Pupils Equal Round Reactive to light, Extraocular movements intact,  Conjunctiva without redness, discharge or icterus. Neck/lymp/endocrine: Supple,small non tender left ant cervical lymphadenopathy thyromegaly CV: RRR  Chest: CTAB, no wheeze, rhonchi or crackles.  Skin: no rashes, purpura or petechiae. Warm and well-perfused. Skin intact. Neuro/Msk: Normal gait. PERLA. EOMi. Alert. Oriented x3.    Results for orders placed or performed in visit on 12/06/17 (from the past 24 hour(s))  POCT rapid strep A     Status: Normal   Collection Time: 12/06/17  4:16 PM  Result Value Ref Range   Rapid Strep A Screen Negative Negative    Assessment/plan: Esmee Fallaw is a 52 y.o. female present for establishment with acute visit.  Pharyngitis, unspecified etiology Negative rapid strep today. Throat culture sent. Pt will be informed of results once completed.  - Augmentin script provided today (printed). Advised pt to start med ONLY if worsening sx, fever development or if her throat culture returns positive. Pt understands and agrees with plan.  Rest, hydrate.  + flonase, mucinex (DM if cough), nettie pot or nasal saline.  - Upper Respiratory Culture - POCT rapid strep A  hypothyroidism Levothyroxine 112 mcg--> followed by GYN  Macrocytosis without anemia Seems new to patient. She endorses daily wine consumption and feels that was the cause. No prior records to compare. She quit all alcohol and is following with GYN for this condition.    Return in about 2 weeks (around 12/20/2017), or if symptoms worsen or fail to improve.   Note is dictated utilizing voice recognition software. Although note has been proof read  prior to signing, occasional typographical errors still can be missed. If any questions arise, please do not hesitate to call for verification.  Electronically signed by: Felix Pacini, DO [facility]

## 2017-12-09 LAB — CULTURE, UPPER RESPIRATORY
MICRO NUMBER:: 90220968
SPECIMEN QUALITY:: ADEQUATE

## 2017-12-13 ENCOUNTER — Encounter: Payer: Self-pay | Admitting: Obstetrics & Gynecology

## 2017-12-13 ENCOUNTER — Telehealth: Payer: Self-pay | Admitting: Obstetrics & Gynecology

## 2017-12-13 DIAGNOSIS — R17 Unspecified jaundice: Secondary | ICD-10-CM

## 2017-12-13 DIAGNOSIS — R7989 Other specified abnormal findings of blood chemistry: Secondary | ICD-10-CM

## 2017-12-13 NOTE — Telephone Encounter (Signed)
Order for CMP and hepatic function panel placed previously placed. Routing to Dr.Milelr for review and advise of additional lab testing.

## 2017-12-13 NOTE — Telephone Encounter (Signed)
Order for CMP and hepatic function panel placed. Encounter closed.

## 2017-12-13 NOTE — Telephone Encounter (Signed)
Patient scheduled a lab appointment 01/06/17. Patient states she did discuss these labs with Dr.Miller, will need orders. No need to return call to patient.

## 2017-12-13 NOTE — Telephone Encounter (Signed)
-----   Message from Mychart, Generic sent at 12/13/2017 2:56 PM EST -----    Scheduled the test for March 22. Can you add an order for hormone test as well? I will have been off the pill for 4+ weeks and would like to start getting a handle on all things related

## 2017-12-14 NOTE — Telephone Encounter (Signed)
Orders are correct.  She is also going to have lab work in six months time frame from [facility].  Thanks.  Ok to close encounter.

## 2018-01-06 ENCOUNTER — Other Ambulatory Visit (INDEPENDENT_AMBULATORY_CARE_PROVIDER_SITE_OTHER): Payer: Managed Care, Other (non HMO)

## 2018-01-06 DIAGNOSIS — R17 Unspecified jaundice: Secondary | ICD-10-CM

## 2018-01-06 DIAGNOSIS — R7989 Other specified abnormal findings of blood chemistry: Secondary | ICD-10-CM

## 2018-01-07 LAB — COMPREHENSIVE METABOLIC PANEL
ALK PHOS: 54 IU/L (ref 39–117)
ALT: 25 IU/L (ref 0–32)
AST: 30 IU/L (ref 0–40)
Albumin/Globulin Ratio: 1.7 (ref 1.2–2.2)
Albumin: 4.3 g/dL (ref 3.5–5.5)
BILIRUBIN TOTAL: 1.2 mg/dL (ref 0.0–1.2)
BUN/Creatinine Ratio: 13 (ref 9–23)
BUN: 13 mg/dL (ref 6–24)
CO2: 22 mmol/L (ref 20–29)
Calcium: 9.6 mg/dL (ref 8.7–10.2)
Chloride: 100 mmol/L (ref 96–106)
Creatinine, Ser: 0.97 mg/dL (ref 0.57–1.00)
GFR calc Af Amer: 79 mL/min/{1.73_m2} (ref >59)
GFR calc non Af Amer: 69 mL/min/{1.73_m2} (ref >59)
Globulin, Total: 2.5 g/dL (ref 1.5–4.5)
Glucose: 73 mg/dL (ref 65–99)
Potassium: 4.8 mmol/L (ref 3.5–5.2)
Sodium: 138 mmol/L (ref 134–144)
Total Protein: 6.8 g/dL (ref 6.0–8.5)

## 2018-01-07 LAB — HEPATIC FUNCTION PANEL: BILIRUBIN, DIRECT: 0.27 mg/dL (ref 0.00–0.40)

## 2018-01-09 ENCOUNTER — Encounter: Payer: Self-pay | Admitting: Obstetrics & Gynecology

## 2018-01-09 ENCOUNTER — Telehealth: Payer: Self-pay | Admitting: Obstetrics & Gynecology

## 2018-01-09 NOTE — Telephone Encounter (Signed)
Patient sent the following correspondence through MyChart. Routing to triage to assist patient with request.  ----- Message from Mychart, Generic sent at 01/09/2018 1:29 PM EDT -----    Based on recent tests...  What is the difference between Bilirubin Total 1.2 mg/dL 0.0 - 1.2 mg/dL, and Bilirubin Direct which showed .27?  Also, can you make a note that my grandmother and mother both had to have their gal bladders removed.    When can i get a test to measure hormones? I am wanting to understand if i still am ovulating and if i need to perhaps take progesterone as birth control vs. using condoms for [facility]. I am dry and this is uncomfortable.  Also experiencing temperature up and downs but not a full blown hot flash.

## 2018-01-10 NOTE — Telephone Encounter (Signed)
Routing to Dr. Miller to review and advise.  

## 2018-01-23 ENCOUNTER — Other Ambulatory Visit: Payer: Self-pay | Admitting: Obstetrics & Gynecology

## 2018-01-26 ENCOUNTER — Other Ambulatory Visit: Payer: Self-pay

## 2018-02-03 ENCOUNTER — Encounter: Payer: Self-pay | Admitting: Obstetrics & Gynecology

## 2018-02-03 NOTE — Telephone Encounter (Signed)
Questions answered via MyChart.  Pt may want hormonal testing.  If so, ok to place [facility] and estradiol.  Thanks.

## 2018-03-02 NOTE — Telephone Encounter (Signed)
OK to close encounter. 

## 2018-03-02 NOTE — Telephone Encounter (Signed)
Okay to close encounter or is further follow up needed? °

## 2018-05-30 ENCOUNTER — Other Ambulatory Visit: Payer: Self-pay | Admitting: Obstetrics & Gynecology

## 2018-05-30 NOTE — Telephone Encounter (Signed)
Medication refill request: Hydrochlorathizide Last AEX:  11/08/2017 Next AEX: 01/16/2019  Refill authorized: #[id number], 0 refills, please advise

## 2018-12-19 ENCOUNTER — Ambulatory Visit: Payer: Managed Care, Other (non HMO) | Admitting: Family Medicine

## 2018-12-19 ENCOUNTER — Encounter: Payer: Self-pay | Admitting: Family Medicine

## 2018-12-19 VITALS — BP 95/60 | HR 64 | Temp 98.3°F | Resp 16 | Ht 64.0 in | Wt 133.2 lb

## 2018-12-19 DIAGNOSIS — D7589 Other specified diseases of blood and blood-forming organs: Secondary | ICD-10-CM

## 2018-12-19 DIAGNOSIS — R591 Generalized enlarged lymph nodes: Secondary | ICD-10-CM

## 2018-12-19 MED ORDER — AZITHROMYCIN 250 MG PO TABS: ORAL_TABLET | ORAL | 0 refills | Status: DC

## 2018-12-19 NOTE — Patient Instructions (Addendum)
Start azithromycin.  followup in 2 weeks for recheck. If they are still present we will complete labs and get an [place].   B complex start daily.    Lymphadenopathy  Lymphadenopathy means that your lymph glands are swollen or larger than normal (enlarged). Lymph glands, also called lymph nodes, are collections of tissue that filter bacteria, viruses, and waste from your bloodstream. They are part of your body's disease-fighting system (immune system), which protects your body from germs. There may be different causes of lymphadenopathy, depending on where it is in your body. Some types go away on their own. Lymphadenopathy can occur anywhere that you have lymph glands, including these areas:  Neck (cervical lymphadenopathy).  Chest (mediastinal lymphadenopathy).  Lungs (hilar lymphadenopathy).  Underarms (axillary lymphadenopathy).  Groin (inguinal lymphadenopathy). When your immune system responds to germs, infection-fighting cells and fluid build up in your lymph glands. This causes some swelling and enlargement. If the lymph glands do not go back to normal after you have an infection or disease, your health care provider may do tests. These tests help to monitor your condition and find the reason why the glands are still swollen and enlarged. Follow these instructions at home:  Get plenty of rest.  Take over-the-counter and prescription medicines only as told by your health care provider. Your health care provider may recommend over-the-counter medicines for pain.  If directed, apply heat to swollen lymph glands as often as told by your health care provider. Use the heat source that your health care provider recommends, such as a moist heat pack or a heating pad. ? Place a towel between your skin and the heat source. ? Leave the heat on for 20-30 minutes. ? Remove the heat if your skin turns bright red. This is especially important if you are unable to feel pain, heat, or cold. You may  have a greater risk of getting burned.  Check your affected lymph glands every day for changes. Check other lymph gland areas as told by your health care provider. Check for changes such as: ? More swelling. ? Sudden increase in size. ? Redness or pain. ? Hardness.  Keep all follow-up visits as told by your health care provider. This is important. Contact a health care provider if you have:  Swelling that gets worse or spreads to other areas.  Problems with breathing.  Lymph glands that: ? Are still swollen after 2 weeks. ? Have suddenly gotten bigger. ? Are red, painful, or hard.  A fever or chills.  Fatigue.  A sore throat.  Pain in your abdomen.  Weight loss.  Night sweats. Get help right away if you have:  Fluid leaking from an enlarged lymph gland.  Severe pain.  Chest pain.  Shortness of breath. Summary  Lymphadenopathy means that your lymph glands are swollen or larger than normal (enlarged).  Lymph glands (also called lymph nodes) are collections of tissue that filter bacteria, viruses, and waste from the bloodstream. They are part of your body's disease-fighting system (immune system).  Lymphadenopathy can occur anywhere that you have lymph glands.  If your enlarged and swollen lymph glands do not go back to normal after you have an infection or disease, your health care provider may do tests to monitor your condition and find the reason why the glands are still swollen and enlarged.  Check your affected lymph glands every day for changes. Check other lymph gland areas as told by your health care provider. This information is not intended to  replace advice given to you by your health care provider. Make sure you discuss any questions you have with your health care provider. Document Released: 07/13/2008 Document Revised: 08/19/2017 Document Reviewed: 08/19/2017 Elsevier Interactive Patient Education  2019 [employer].

## 2018-12-19 NOTE — Progress Notes (Signed)
Karen Becker , 1966-03-09, 53 y.o., female MRN: [id number] Patient Care Team    Relationship Specialty Notifications Start End  Natalia Leatherwood, DO PCP - General Family Medicine  12/06/17   Jake Bathe, MD Consulting Physician Cardiology  12/06/17   Charna Elizabeth, MD Consulting Physician Gastroenterology  12/06/17   Micki Riley, MD Consulting Physician Neurology  12/06/17    Comment: Migraine  Jerene Bears, MD Consulting Physician Gynecology  12/06/17     Chief Complaint  Patient presents with  . swollen lymph nodes  . Sinusitis     Subjective: Pt presents for an OV with complaints of left-sided swollen lymph nodes of 1 month duration.  She reports she had a headache and sinus pressure 2 weeks ago, that resolved.  She states she was late to get her allergy shot so her sinuses flared.  4 weeks ago she noticed left anterior cervical lymphadenopathy.  Over the last 1 to 2 weeks she feels that the lymphadenopathy has increased along the same cervical chain.  She reports she has "tight scalenes "and had a vigorous massage and is wondering if that could have caused her nodes to be irritated.  He denies any fever, chills, nausea, ear pain or sore throat.  She does have some ear fullness at times bilaterally. 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  Patient was made aware of her findings last year and she felt the red wine consumption could be the cause of her macrocytosis.  She has not had repeat labs since.  She elected to be followed by  gynecology for that condition.  SHe has a thyroid disorder that is managed by gynecology.  Depression screen [facility] 2/9 12/06/2017  Decreased Interest 0  Down, Depressed, Hopeless 0  PHQ - 2 Score 0  Altered sleeping 0  Tired, decreased energy 0  Change in appetite 0  Feeling bad or failure about yourself  0  Trouble concentrating 0  Moving slowly or fidgety/restless 0  Suicidal thoughts 0  PHQ-9 Score 0    Allergies  Allergen Reactions   . Adhesive [Tape]   . 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    Takes a daily vitamin.    Uses herbal remedies.    Drinks caffeine.    Smoke alarm in the home.    Wears seatbelt.    Feels safe in her relationships.   Past Medical History:  Diagnosis Date  . Allergy   . Asthma   . 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 Environmental allergies   . Frequent headaches   . GERD (gastroesophageal reflux disease)   . Hypothyroidism   . Migraines    with aura  . Palpitations    established with cardiology, resolved. felt to be possibly anxiety related.   . Wears contact lenses    Past Surgical History:  Procedure Laterality Date  . ABLATION  2015   Tonsils  . CLEFT PALATE REPAIR    . COLONOSCOPY     Dr. Loreta Ave, possible colon polyps. pt not certain. Seen for constipation per pt, but has had colonoscopyx2 since age 45   . SEPTOPLASTY  11/2014  . TENDON RECONSTRUCTION Right 12/02/2014   Procedure: Right elbow common extensor repair;  Surgeon: Mable Paris, MD;  Location: [facility];  Service: Orthopedics;  Laterality: Right;  Right elbow common extensor repair   Family History  Problem Relation Age of Onset  . Alzheimer's disease Mother   . Atrial fibrillation Mother   . Stroke Mother   . Bipolar disorder Father   . Diabetes Mellitus II Father   . Depression Father   . Drug abuse Father   . Heart disease Father   . Hyperlipidemia Father   . Breast cancer Maternal Grandmother 58   Allergies as of 12/19/2018      Reactions   Adhesive [tape]    Latex Rash      Medication List       Accurate as of December 19, 2018  3:42 PM. Always use your most recent med list.        albuterol 1.25 MG/3ML nebulizer solution Commonly known as:  ACCUNEB Take 1 ampule by nebulization every 6 (six) hours as needed for wheezing (sport induced).   betamethasone dipropionate  0.05 % lotion 1 APPLICATION APPLY ON THE SKIN DAILY TO TWICE DAILY AS NEEDED FOR SCALP RASH AND ITCHING   cetirizine 10 MG tablet Commonly known as:  ZYRTEC Take 10 mg by mouth daily.   ESTROVEN PO Take by mouth. Taking Mood in the morning and weight management at night.   Evening Primrose Oil 1000 MG Caps Take by mouth.   hydrochlorothiazide 25 MG tablet Commonly known as:  HYDRODIURIL TAKE 1 TABLET BY MOUTH EVERY DAY . USE AS NEEDED ONLY   IPRATROPIUM BROMIDE IN Inhale into the lungs.   PARoxetine Mesylate 7.5 MG Caps Take by mouth.   SYNTHROID 112 MCG tablet Generic drug:  levothyroxine Take 1 tablet (112 mcg total) by mouth daily. One po qd   TURMERIC PO Take by mouth daily.       All past medical history, surgical history, allergies, family history, immunizations andmedications were updated in the EMR today and reviewed under the history and medication portions of their EMR.     ROS: Negative, with the exception of above mentioned in HPI   Objective:  BP 95/60 (BP Location: Right Arm, Patient Position: Sitting, Cuff Size: Normal)   Pulse 64   Temp 98.3 F (36.8 C) (Oral)   Resp 16   Ht  (1.626 m)   Wt 133 lb 3.2 oz (60.4 kg)   SpO2 99%   BMI 22.86 kg/m  Body mass index is 22.86 kg/m. Gen: Afebrile. No acute distress. Nontoxic in appearance, well developed, well nourished.  HENT: AT. [place]. Bilateral TM visualized no erythema or fullness. MMM, no oral lesions. Bilateral nares without erythema, drainage or swelling. Throat without erythema or exudates.  No cough.  No hoarseness.  No postnasal drip Eyes:Pupils Equal Round Reactive to light, Extraocular movements intact,  Conjunctiva without redness, discharge or icterus. Neck/lymp/endocrine: Supple, left anterior cervical chain lymphadenopathy present.  X1 lymph node palpated right anterior cervical chain. CV: RRR  Chest: CTAB, no wheeze or crackles. Good air movement, normal resp effort.  Skin: no rashes,  purpura or petechiae.  Neuro:  Normal gait. PERLA. EOMi. Alert. Oriented x3   No exam data present No results found. No results found for this or any previous visit (from the past 24 hour(s)).  Assessment/Plan: Karen Becker is a 53 y.o. female present for OV for  Lymphadenopathy/Macrocytosis without anemia Patient reports lymphadenopathy has been present greater than 4 weeks.  It is nontender to palpation today.  Had sinus symptoms 2 weeks ago, but nothing present.  Exam is normal with  the exception of the lymphadenopathy.  If this has anything to do with her prior macrocytosis, however it had not been followed up on. -Encouraged her to start a B12 complex.  Refrain from alcohol consumption routinely. -Z-Pak prescribed. -Consider collecting TSH, B12 and folate if macrocytosis still present. -Follow-up in 2 weeks to recheck lymph nodes.  If still present will order ultrasound for evaluation.  We will collect CBC at that time to follow-up on macrocytosis with smear.     Reviewed expectations re: course of current medical issues.  Discussed self-management of symptoms.  Outlined signs and symptoms indicating need for more acute intervention.  Patient verbalized understanding and all questions were answered.  Patient received an After-Visit Summary.    No orders of the defined types were placed in this encounter.    Note is dictated utilizing voice recognition software. Although note has been proof read prior to signing, occasional typographical errors still can be missed. If any questions arise, please do not hesitate to call for verification.   electronically signed by:  Felix Pacini, DO  [facility] - [place]

## 2019-01-02 ENCOUNTER — Encounter: Payer: Self-pay | Admitting: Family Medicine

## 2019-01-05 ENCOUNTER — Encounter: Payer: Self-pay | Admitting: Family Medicine

## 2019-01-05 ENCOUNTER — Ambulatory Visit: Payer: Managed Care, Other (non HMO) | Admitting: Family Medicine

## 2019-01-05 ENCOUNTER — Other Ambulatory Visit: Payer: Self-pay

## 2019-01-05 ENCOUNTER — Telehealth: Payer: Self-pay | Admitting: Family Medicine

## 2019-01-05 VITALS — BP 104/70 | HR 75 | Temp 98.3°F | Resp 16 | Ht 64.0 in | Wt 131.2 lb

## 2019-01-05 DIAGNOSIS — R591 Generalized enlarged lymph nodes: Secondary | ICD-10-CM | POA: Diagnosis not present

## 2019-01-05 DIAGNOSIS — D7589 Other specified diseases of blood and blood-forming organs: Secondary | ICD-10-CM | POA: Diagnosis not present

## 2019-01-05 DIAGNOSIS — E039 Hypothyroidism, unspecified: Secondary | ICD-10-CM

## 2019-01-05 LAB — CBC WITH DIFFERENTIAL/PLATELET
BASOS ABS: 0.1 10*3/uL (ref 0.0–0.1)
Basophils Relative: 1.2 % (ref 0.0–3.0)
Eosinophils Absolute: 0.2 10*3/uL (ref 0.0–0.7)
Eosinophils Relative: 3.4 % (ref 0.0–5.0)
HCT: 40.3 % (ref 36.0–46.0)
Hemoglobin: 13.8 g/dL (ref 12.0–15.0)
Lymphocytes Relative: 36.6 % (ref 12.0–46.0)
Lymphs Abs: 2.1 10*3/uL (ref 0.7–4.0)
MCHC: 34.2 g/dL (ref 30.0–36.0)
MCV: 100.3 fl — ABNORMAL HIGH (ref 78.0–100.0)
Monocytes Absolute: 0.5 10*3/uL (ref 0.1–1.0)
Monocytes Relative: 9.2 % (ref 3.0–12.0)
NEUTROS PCT: 49.6 % (ref 43.0–77.0)
Neutro Abs: 2.8 10*3/uL (ref 1.4–7.7)
Platelets: 315 10*3/uL (ref 150.0–400.0)
RBC: 4.02 Mil/uL (ref 3.87–5.11)
RDW: 12.6 % (ref 11.5–15.5)
WBC: 5.7 10*3/uL (ref 4.0–10.5)

## 2019-01-05 LAB — B12 AND FOLATE PANEL
Folate: 23.5 ng/mL (ref >5.9)
Vitamin B-12: 637 pg/mL (ref 211–911)

## 2019-01-05 LAB — TSH: TSH: 4.43 u[IU]/mL (ref 0.35–4.50)

## 2019-01-05 NOTE — Patient Instructions (Signed)
I will call you with lab results once we get them back.  I will order an Ultrasound for you today. It will take  A couple a weeks to get this scheduled with the COVID outbreak all images not STAT are being paused.   Hang in there. We will get answers.    Lymphadenopathy  Lymphadenopathy means that your lymph glands are swollen or larger than normal (enlarged). Lymph glands, also called lymph nodes, are collections of tissue that filter bacteria, viruses, and waste from your bloodstream. They are part of your body's disease-fighting system (immune system), which protects your body from germs. There may be different causes of lymphadenopathy, depending on where it is in your body. Some types go away on their own. Lymphadenopathy can occur anywhere that you have lymph glands, including these areas:  Neck (cervical lymphadenopathy).  Chest (mediastinal lymphadenopathy).  Lungs (hilar lymphadenopathy).  Underarms (axillary lymphadenopathy).  Groin (inguinal lymphadenopathy). When your immune system responds to germs, infection-fighting cells and fluid build up in your lymph glands. This causes some swelling and enlargement. If the lymph glands do not go back to normal after you have an infection or disease, your health care provider may do tests. These tests help to monitor your condition and find the reason why the glands are still swollen and enlarged. Follow these instructions at home:  Get plenty of rest.  Take over-the-counter and prescription medicines only as told by your health care provider. Your health care provider may recommend over-the-counter medicines for pain.  If directed, apply heat to swollen lymph glands as often as told by your health care provider. Use the heat source that your health care provider recommends, such as a moist heat pack or a heating pad. ? Place a towel between your skin and the heat source. ? Leave the heat on for 20-30 minutes. ? Remove the heat if  your skin turns bright red. This is especially important if you are unable to feel pain, heat, or cold. You may have a greater risk of getting burned.  Check your affected lymph glands every day for changes. Check other lymph gland areas as told by your health care provider. Check for changes such as: ? More swelling. ? Sudden increase in size. ? Redness or pain. ? Hardness.  Keep all follow-up visits as told by your health care provider. This is important. Contact a health care provider if you have:  Swelling that gets worse or spreads to other areas.  Problems with breathing.  Lymph glands that: ? Are still swollen after 2 weeks. ? Have suddenly gotten bigger. ? Are red, painful, or hard.  A fever or chills.  Fatigue.  A sore throat.  Pain in your abdomen.  Weight loss.  Night sweats. Get help right away if you have:  Fluid leaking from an enlarged lymph gland.  Severe pain.  Chest pain.  Shortness of breath. Summary  Lymphadenopathy means that your lymph glands are swollen or larger than normal (enlarged).  Lymph glands (also called lymph nodes) are collections of tissue that filter bacteria, viruses, and waste from the bloodstream. They are part of your body's disease-fighting system (immune system).  Lymphadenopathy can occur anywhere that you have lymph glands.  If your enlarged and swollen lymph glands do not go back to normal after you have an infection or disease, your health care provider may do tests to monitor your condition and find the reason why the glands are still swollen and enlarged.  Check your  affected lymph glands every day for changes. Check other lymph gland areas as told by your health care provider. This information is not intended to replace advice given to you by your health care provider. Make sure you discuss any questions you have with your health care provider. Document Released: 07/13/2008 Document Revised: 08/19/2017 Document  Reviewed: 08/19/2017 Elsevier Interactive Patient Education  2019 [employer].

## 2019-01-05 NOTE — Telephone Encounter (Signed)
LM for patient to CB about HIPPA form she completed this morning. Currently the form will not let us to bill her insurance for today's visit. Patient will need to update the form if she would like [place] to bill the insurance.

## 2019-01-05 NOTE — Telephone Encounter (Signed)
Patient called back. She would like to revoke the form she gave me today and complete a new one. She will probably come to the office on Monday 01/08/19. Form is being held up front.

## 2019-01-05 NOTE — Progress Notes (Signed)
Karen Becker , May 23, 1966, 53 y.o., female MRN: [id number] Patient Care Team    Relationship Specialty Notifications Start End  Natalia Leatherwood, DO PCP - General Family Medicine  12/06/17   Jake Bathe, MD Consulting Physician Cardiology  12/06/17   Charna Elizabeth, MD Consulting Physician Gastroenterology  12/06/17   Micki Riley, MD Consulting Physician Neurology  12/06/17    Comment: Migraine  Jerene Bears, MD Consulting Physician Gynecology  12/06/17     Chief Complaint  Patient presents with  . Follow-up    LAD     Subjective:  Karen Becker is a 53 y.o. female. Present for follow-up on lymphadenopathy. She was seen 12/19/2018 with complaints of swollen lymph nodes left neck for > 4 weeks. Pt was provided w/ z-pack and asked him to monitor and follow-up in 4 weeks. She states her lymph node may be a little smaller, but still present. She denies new rash, headaches, fever, chills or weight loss. She has had "sweats" but she has hot flashes from menopause.  She reports she did start the B12 complex after our last visit approximately 4 weeks ago.  She has refrained from any alcohol use.  She is extremely concerned she has a chronic disease process and has been researching on the Internet a great deal. Prior note: Pt presents for an OV with complaints of left-sided swollen lymph nodes of 1 month duration.  She reports she had a headache and sinus pressure 2 weeks ago, that resolved.  She states she was late to get her allergy shot so her sinuses flared.  4 weeks ago she noticed left anterior cervical lymphadenopathy.  Over the last 1 to 2 weeks she feels that the lymphadenopathy has increased along the same cervical chain.  She reports she has "tight scalenes "and had a vigorous massage and is wondering if that could have caused her nodes to be irritated.  He denies any fever, chills, nausea, ear pain or sore throat.  She does have some ear fullness at times bilaterally. Of note she has had  macrocytosis with an MCV of 105.  Patient was made aware of her findings last year and she felt the red wine consumption could be the cause of her macrocytosis.  She has not had repeat labs since.  She elected to be followed by  gynecology for that condition.  SHe has a thyroid disorder that is managed by gynecology.  Depression screen [facility] 2/9 12/06/2017  Decreased Interest 0  Down, Depressed, Hopeless 0  PHQ - 2 Score 0  Altered sleeping 0  Tired, decreased energy 0  Change in appetite 0  Feeling bad or failure about yourself  0  Trouble concentrating 0  Moving slowly or fidgety/restless 0  Suicidal thoughts 0  PHQ-9 Score 0    Allergies  Allergen Reactions  . Adhesive [Tape]   . 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    Takes a daily vitamin.    Uses herbal remedies.    Drinks caffeine.    Smoke alarm in the home.    Wears seatbelt.    Feels safe in her relationships.   Past Medical History:  Diagnosis Date  . Allergy   . Asthma   . 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 Environmental allergies   . Frequent headaches   . GERD (  gastroesophageal reflux disease)   . Hypothyroidism   . Migraines    with aura  . Palpitations    established with cardiology, resolved. felt to be possibly anxiety related.   . Wears contact lenses    Past Surgical History:  Procedure Laterality Date  . ABLATION  2015   Tonsils  . CLEFT PALATE REPAIR    . COLONOSCOPY     Dr. Loreta Ave, possible colon polyps. pt not certain. Seen for constipation per pt, but has had colonoscopyx2 since age 70   . SEPTOPLASTY  11/2014  . TENDON RECONSTRUCTION Right 12/02/2014   Procedure: Right elbow common extensor repair;  Surgeon: Mable Paris, MD;  Location: [facility];  Service: Orthopedics;  Laterality: Right;  Right elbow common extensor repair   Family History  Problem Relation Age of  Onset  . Alzheimer's disease Mother   . Atrial fibrillation Mother   . Stroke Mother   . Bipolar disorder Father   . Diabetes Mellitus II Father   . Depression Father   . Drug abuse Father   . Heart disease Father   . Hyperlipidemia Father   . Breast cancer Maternal Grandmother 101   Allergies as of 01/05/2019      Reactions   Adhesive [tape]    Latex Rash      Medication List       Accurate as of January 05, 2019  8:12 AM. Always use your most recent med list.        albuterol 1.25 MG/3ML nebulizer solution Commonly known as:  ACCUNEB Take 1 ampule by nebulization every 6 (six) hours as needed for wheezing (sport induced).   betamethasone dipropionate 0.05 % lotion 1 APPLICATION APPLY ON THE SKIN DAILY TO TWICE DAILY AS NEEDED FOR SCALP RASH AND ITCHING   cetirizine 10 MG tablet Commonly known as:  ZYRTEC Take 10 mg by mouth daily.   ESTROVEN PO Take by mouth. Taking Mood in the morning and weight management at night.   Evening Primrose Oil 1000 MG Caps Take by mouth.   hydrochlorothiazide 25 MG tablet Commonly known as:  HYDRODIURIL TAKE 1 TABLET BY MOUTH EVERY DAY . USE AS NEEDED ONLY   IPRATROPIUM BROMIDE IN Inhale into the lungs.   One-A-Day Womens Formula Tabs Take 1 tablet by mouth daily.   PARoxetine Mesylate 7.5 MG Caps Take by mouth.   Synthroid 112 MCG tablet Generic drug:  levothyroxine Take 1 tablet (112 mcg total) by mouth daily. One po qd   TURMERIC PO Take by mouth daily.       All past medical history, surgical history, allergies, family history, immunizations andmedications were updated in the EMR today and reviewed under the history and medication portions of their EMR.     ROS: Negative, with the exception of above mentioned in HPI   Objective:  BP 104/70 (BP Location: Left Arm, Patient Position: Sitting, Cuff Size: Normal)   Pulse 75   Temp 98.3 F (36.8 C) (Oral)   Resp 16   Ht 5\' 4"  (1.626 m)   Wt 131 lb 4 oz (59.5 kg)    LMP  (LMP Unknown) Comment: has been at least 3 months  SpO2 99%   BMI 22.53 kg/m  Body mass index is 22.53 kg/m. Gen: Afebrile. No acute distress.  Nontoxic in presentation, well-developed, well-nourished, Caucasian female. HENT: AT. [place]. Bilateral TM visualized and normal in appearance. MMM. Bilateral nares without erythema, swelling or drainage. Throat without erythema or exudates.  No cough.  No hoarseness. Eyes:Pupils Equal Round Reactive to light, Extraocular movements intact,  Conjunctiva without redness, discharge or icterus. Neck/lymp/endocrine: Supple, x1 node left anterior cervical still present, but does feel smaller CV: RRR  Chest: CTAB, no wheeze or crackles Skin: no rashes, purpura or petechiae.  Neuro: Normal gait. PERLA. EOMi. Alert. Oriented x3  Psych: Mildly anxious, otherwise normal affect, dress and demeanor. Normal speech. Normal thought content and judgment.  No exam data present No results found. No results found for this or any previous visit (from the past 24 hour(s)).  Assessment/Plan: Karen Becker is a 53 y.o. female present for OV for  Lymphadenopathy/Macrocytosis without anemia -Lymphadenopathy now present greater than 7 weeks.  There does seem to be some mild improvement with time and a Z-Pak however 1 node does remain enlarged in the left anterior cervical chain.  We will provide laboratory work-up for her lymphadenopathy and her macrocytosis. - Continue B12 complex.  Refrain from alcohol consumption routinely. -TSH (Synthroid 112 mcg daily), B12 and folate, CBC with differential, pathologist review -We will order ultrasound of neck as well, I did inform patient it would likely not be scheduled for few weeks secondary to COVID19 outbreak imaging centers are not scheduling anything that is not emergent. - F/U dependent on lab and image results.    Reviewed expectations re: course of current medical issues.  Discussed self-management of symptoms.   Outlined signs and symptoms indicating need for more acute intervention.  Patient verbalized understanding and all questions were answered.  Patient received an After-Visit Summary.    No orders of the defined types were placed in this encounter.  > 25 minutes spent with patient, >50% of time spent face to face counseling    Note is dictated utilizing voice recognition software. Although note has been proof read prior to signing, occasional typographical errors still can be missed. If any questions arise, please do not hesitate to call for verification.   electronically signed by:  Felix Pacini, DO  [facility] [place]

## 2019-01-08 LAB — PATHOLOGIST SMEAR REVIEW

## 2019-01-09 NOTE — Telephone Encounter (Signed)
Pt called left message on [facility] Voicemail box stating that she needs form faxed to her at [phone]. States she can be reached at [phone] if needed.

## 2019-01-10 NOTE — Telephone Encounter (Signed)
Faxed form per patient request.

## 2019-01-12 NOTE — Telephone Encounter (Signed)
Pt called in to speak with Diane. Pt says that she is trying to confirm receipt of fax back to Diane. Pt would like a call back from Diane at the number on file.

## 2019-01-13 ENCOUNTER — Other Ambulatory Visit: Payer: Self-pay | Admitting: Obstetrics & Gynecology

## 2019-01-15 NOTE — Telephone Encounter (Signed)
TSH from 10 days ago was 4.43 (down from 4.89 last year). Will refill her synthroid for a year

## 2019-01-15 NOTE — Telephone Encounter (Signed)
Left VM that form has been received and faxed to the legal department for review

## 2019-01-15 NOTE — Telephone Encounter (Signed)
Medication refill request: Synthroid Last AEX:  11/08/17 SM Next AEX: appointment that was scheduled for 01/16/19 was canceled Last MMG (if hormonal medication request): 11/07/17 BIRADS 1 negative/density c Refill authorized: Please advise on refills; Order pended for #90 w/0 refills    Spoke with patient regarding Bupropion. Patient states that she is no longer taking this and clicked the refill option on [employer] by mistake. Patient does however still need refill on Synthroid.

## 2019-01-16 ENCOUNTER — Other Ambulatory Visit: Payer: Self-pay

## 2019-01-16 ENCOUNTER — Ambulatory Visit
Admission: RE | Admit: 2019-01-16 | Discharge: 2019-01-16 | Disposition: A | Discharge status: Home / Self Care | Payer: Managed Care, Other (non HMO) | Source: Ambulatory Visit | Attending: Family Medicine | Admitting: Family Medicine

## 2019-01-16 ENCOUNTER — Ambulatory Visit: Payer: Managed Care, Other (non HMO) | Admitting: Obstetrics & Gynecology

## 2019-01-16 DIAGNOSIS — D7589 Other specified diseases of blood and blood-forming organs: Secondary | ICD-10-CM

## 2019-01-16 DIAGNOSIS — R591 Generalized enlarged lymph nodes: Secondary | ICD-10-CM

## 2019-01-17 ENCOUNTER — Telehealth: Payer: Self-pay | Admitting: Family Medicine

## 2019-01-17 DIAGNOSIS — R591 Generalized enlarged lymph nodes: Secondary | ICD-10-CM

## 2019-01-17 NOTE — Telephone Encounter (Signed)
Called pt and ultrasound results were explained to patient. Pt verbalized understanding. Pt asked for results to be released from My Chart, this was done. Pt states she does not need a referral but will wait for that to go in because she wants to see the ENT ASAP.

## 2019-01-17 NOTE — Telephone Encounter (Signed)
Attempted to call patient today to discuss her ultrasound results.  DPR does not show permission to leave messages. Attempt to call patient back later today.  Her ultrasound of her lymph nodes of her neck showed small bilateral neck lymph nodes that are not concerning however she does have a larger lymph node on the left with "asymmetric cortical thickening.  " This is  the node we likely were still was able to palpate quite easily.  Cortical thickening in its mildest form can be normal, however it can also mean changes in the nodes or spreading.   For this reason this is considered "indeterminate "ultrasound with neither absolutely normal or abnormal findings.  They need to be followed closely and a possible advanced imaging if felt necessary.  For this reason I have referred her to an ENT for evaluation and monitoring.   Electronically Signed by: Felix Pacini, DO [facility] [place]

## 2019-03-19 ENCOUNTER — Other Ambulatory Visit: Payer: Self-pay | Admitting: Obstetrics & Gynecology

## 2019-03-19 DIAGNOSIS — Z1231 Encounter for screening mammogram for malignant neoplasm of breast: Secondary | ICD-10-CM

## 2019-03-22 ENCOUNTER — Other Ambulatory Visit: Payer: Self-pay

## 2019-03-22 ENCOUNTER — Ambulatory Visit
Admission: RE | Admit: 2019-03-22 | Discharge: 2019-03-22 | Disposition: A | Discharge status: Home / Self Care | Payer: Managed Care, Other (non HMO) | Source: Ambulatory Visit | Attending: Obstetrics & Gynecology | Admitting: Obstetrics & Gynecology

## 2019-03-22 DIAGNOSIS — Z1231 Encounter for screening mammogram for malignant neoplasm of breast: Secondary | ICD-10-CM

## 2019-05-22 ENCOUNTER — Encounter: Payer: Self-pay | Admitting: Family Medicine

## 2019-05-22 NOTE — Telephone Encounter (Signed)
Please advise pt plaquenil has not been proven to be beneficial as a treatment plan in studies. If someone has a positive COVID 19 test and they experience complications requiring medication management they are treated by the inpatient team and infectious disease, who then decides on best treatment options sometimes that includes plaquenil, sometimes it does not.

## 2019-05-22 NOTE — Telephone Encounter (Signed)
Pt wrote the following my chart message: Hi there, just trying to understand your philosophy and [place] acceptance of Hydrocloriquin as a "Possible" treatment for COVID. I know every case is unique and different but want to see if the practice in [facility] or [place] has set any hard and fast rules around not using it and if you saw if it was warranted if you would be willing to prescribe it. This isn't a trick question to share with anyone else, this is me trying to find out which doctors are willing to prescribe should i come down with COVID. Thank you. Ammara  Please advise

## 2019-07-02 ENCOUNTER — Telehealth: Payer: Managed Care, Other (non HMO) | Admitting: Physician Assistant

## 2019-07-02 DIAGNOSIS — J329 Chronic sinusitis, unspecified: Secondary | ICD-10-CM | POA: Diagnosis not present

## 2019-07-02 MED ORDER — AZELASTINE HCL 0.1 % NA SOLN: 2.0000 | Freq: Two times a day (BID) | NASAL | 12 refills | Status: AC

## 2019-07-02 NOTE — Progress Notes (Signed)
We are sorry that you are not feeling well.  Here is how we plan to help!  Based on what you have shared with me it looks like you have sinusitis.  Sinusitis is inflammation and infection in the sinus cavities of the head.  Based on your presentation and duration of illness, I believe you most likely have Acute Viral Sinusitis. This is an infection most likely caused by a virus. There is not specific treatment for viral sinusitis other than to help you with the symptoms until the infection runs its course.  You may use an oral decongestant such as Mucinex D or if you have glaucoma or high blood pressure use plain Mucinex. Saline nasal spray help and can safely be used as often as needed for congestion, I have prescribed: Azelastine nasal spray 2 sprays in each nostril twice a day  Some authorities believe that zinc sprays or the use of Echinacea may shorten the course of your symptoms.  Sinus infections are not as easily transmitted as other respiratory infection, however we still recommend that you avoid close contact with loved ones, especially the very young and elderly.  Remember to wash your hands thoroughly throughout the day as this is the number one way to prevent the spread of infection!  Home Care:  Only take medications as instructed by your medical team.  Do not take these medications with alcohol.  Try using a NetiPot or other saline nasal rinse.   A steam or ultrasonic humidifier can help congestion.  You can place a towel over your head and breathe in the steam from hot water coming from a faucet.  Avoid close contacts especially the very young and the elderly.  Cover your mouth when you cough or sneeze.  Always remember to wash your hands.  Get Help Right Away If:  You develop worsening fever or sinus pain.  You develop a severe head ache or visual changes.  Your symptoms persist after you have completed your treatment plan.  Make sure you  Understand these  instructions.  Will watch your condition.  Will get help right away if you are not doing well or get worse.  Your e-visit answers were reviewed by a board certified advanced clinical practitioner to complete your personal care plan.  Depending on the condition, your plan could have included both over the counter or prescription medications.  If there is a problem please reply  once you have received a response from your provider.  Your safety is important to [place].  If you have drug allergies check your prescription carefully.    You can use MyChart to ask questions about today's visit, request a non-urgent call back, or ask for a work or school excuse for 24 hours related to this e-Visit. If it has been greater than 24 hours you will need to follow up with your provider, or enter a new e-Visit to address those concerns.  You will get an e-mail in the next two days asking about your experience.  I hope that your e-visit has been valuable and will speed your recovery. Thank you for using e-visits.  Greater than 5 minutes, yet less than 10 minutes of time have been spent researching, coordinating and implementing care for this patient today.

## 2020-01-05 ENCOUNTER — Other Ambulatory Visit: Payer: Self-pay | Admitting: Obstetrics and Gynecology

## 2020-01-07 ENCOUNTER — Other Ambulatory Visit: Payer: Self-pay | Admitting: Obstetrics & Gynecology

## 2020-01-07 NOTE — Telephone Encounter (Signed)
Medication refill request: Synthroid 112 mcg Last AEX:  11/08/2017 Next AEX: Not scheduled Last MMG (if hormonal medication request): 03/22/2019 Birads 1 negative Refill authorized: Synthroid 112 mcg #30 0RF pending.   Okay to fill 30 day supply until RN can get patient on the schedule for an annual exam?

## 2020-01-07 NOTE — Telephone Encounter (Signed)
RF completed.  If she does not want to come, she can have her PCP take care of the refill.  Just FYI.

## 2020-01-08 NOTE — Telephone Encounter (Signed)
Left detailed message for patient on voicemail, okay per ROI. Advised of message as seen below from Dr.Miller. Advised for future refills from our office she will need to be seen for an annual exam. Advised to return call with any additional questions/ to schedule an annual exam if she would like. Encounter closed.

## 2020-02-18 ENCOUNTER — Other Ambulatory Visit: Payer: Self-pay | Admitting: Obstetrics & Gynecology

## 2020-02-18 DIAGNOSIS — Z1231 Encounter for screening mammogram for malignant neoplasm of breast: Secondary | ICD-10-CM

## 2020-03-24 ENCOUNTER — Other Ambulatory Visit: Payer: Self-pay

## 2020-03-24 ENCOUNTER — Ambulatory Visit
Admission: RE | Admit: 2020-03-24 | Discharge: 2020-03-24 | Disposition: A | Discharge status: Home / Self Care | Payer: Managed Care, Other (non HMO) | Source: Ambulatory Visit | Attending: Obstetrics & Gynecology | Admitting: Obstetrics & Gynecology

## 2020-03-24 DIAGNOSIS — Z1231 Encounter for screening mammogram for malignant neoplasm of breast: Secondary | ICD-10-CM

## 2020-03-25 ENCOUNTER — Other Ambulatory Visit: Payer: Self-pay | Admitting: Obstetrics & Gynecology

## 2020-03-25 DIAGNOSIS — R928 Other abnormal and inconclusive findings on diagnostic imaging of breast: Secondary | ICD-10-CM

## 2020-03-28 ENCOUNTER — Other Ambulatory Visit: Payer: Self-pay

## 2020-03-28 ENCOUNTER — Ambulatory Visit
Admission: RE | Admit: 2020-03-28 | Discharge: 2020-03-28 | Disposition: A | Discharge status: Home / Self Care | Payer: Managed Care, Other (non HMO) | Source: Ambulatory Visit | Attending: Obstetrics & Gynecology | Admitting: Obstetrics & Gynecology

## 2020-03-28 DIAGNOSIS — R928 Other abnormal and inconclusive findings on diagnostic imaging of breast: Secondary | ICD-10-CM

## 2021-02-16 ENCOUNTER — Other Ambulatory Visit: Payer: Self-pay | Admitting: Obstetrics & Gynecology

## 2021-02-16 DIAGNOSIS — Z1231 Encounter for screening mammogram for malignant neoplasm of breast: Secondary | ICD-10-CM

## 2021-04-08 ENCOUNTER — Ambulatory Visit
Admission: RE | Admit: 2021-04-08 | Discharge: 2021-04-08 | Disposition: A | Discharge status: Home / Self Care | Payer: Managed Care, Other (non HMO) | Source: Ambulatory Visit | Attending: Obstetrics & Gynecology | Admitting: Obstetrics & Gynecology

## 2021-04-08 ENCOUNTER — Other Ambulatory Visit: Payer: Self-pay

## 2021-04-08 DIAGNOSIS — Z1231 Encounter for screening mammogram for malignant neoplasm of breast: Secondary | ICD-10-CM

## 2021-07-21 IMAGING — MG MM DIGITAL SCREENING BILAT W/ TOMO AND CAD
8 series · 9 of 24 positions shown · non-contrast
Comparison: Previous exam(s).

CLINICAL DATA: Screening.

EXAM:
DIGITAL SCREENING BILATERAL MAMMOGRAM WITH TOMOSYNTHESIS AND CAD
TECHNIQUE: Bilateral screening digital craniocaudal and mediolateral oblique
mammograms were obtained. Bilateral screening digital breast
tomosynthesis was performed. The images were evaluated with
computer-aided detection.

[L CC synth-2D]
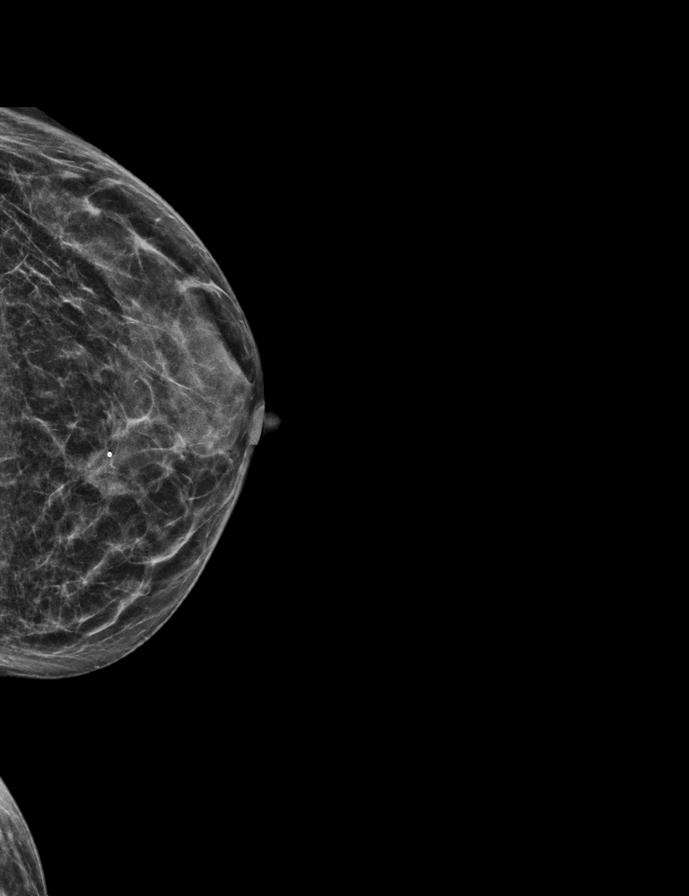

[L MLO synth-2D]
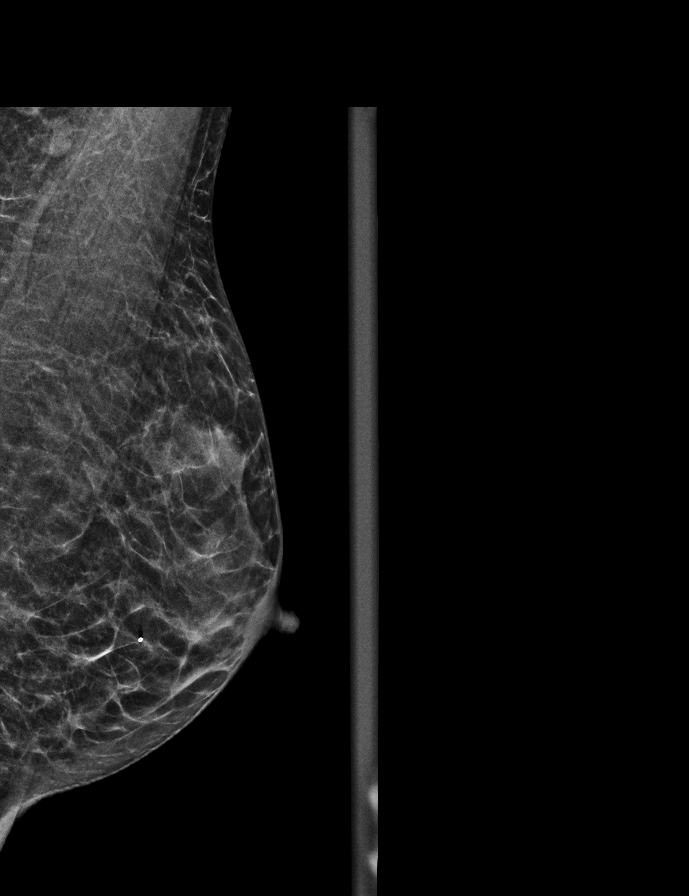

[R MLO synth-2D]
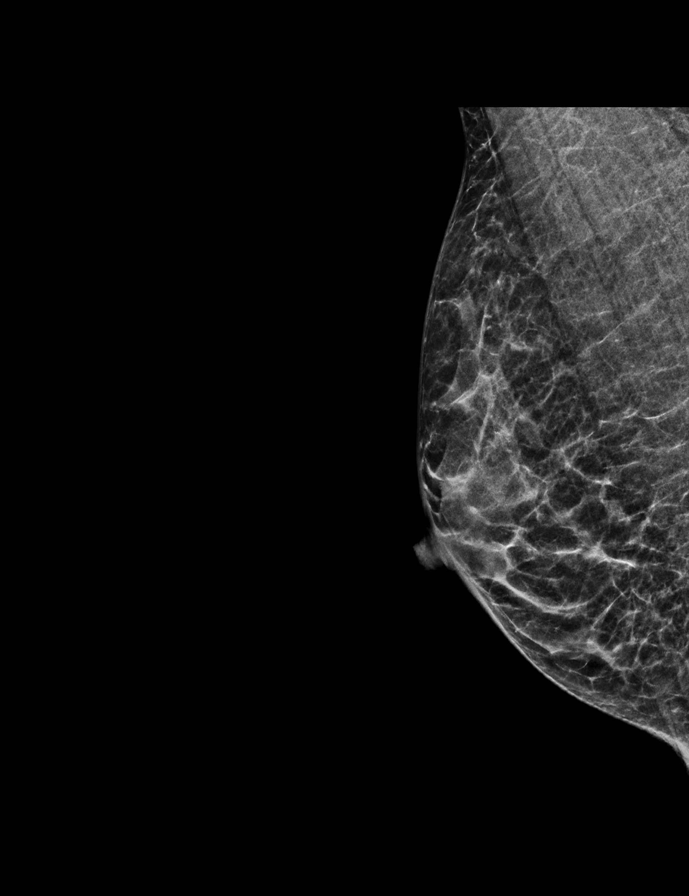

[R CC synth-2D]
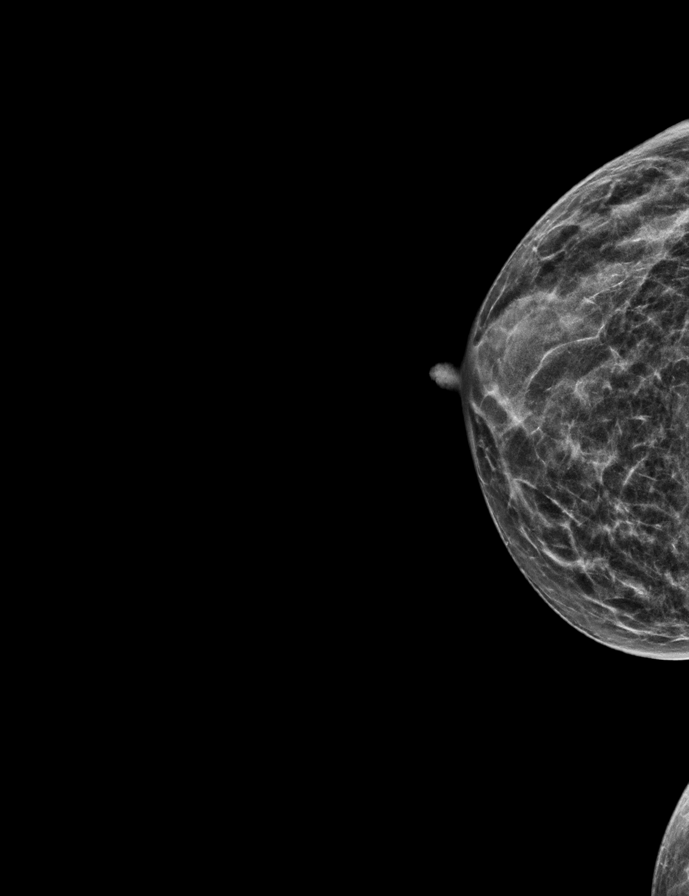

[R CC tomo · 2 of 41 frames shown]
[frame 14/41]
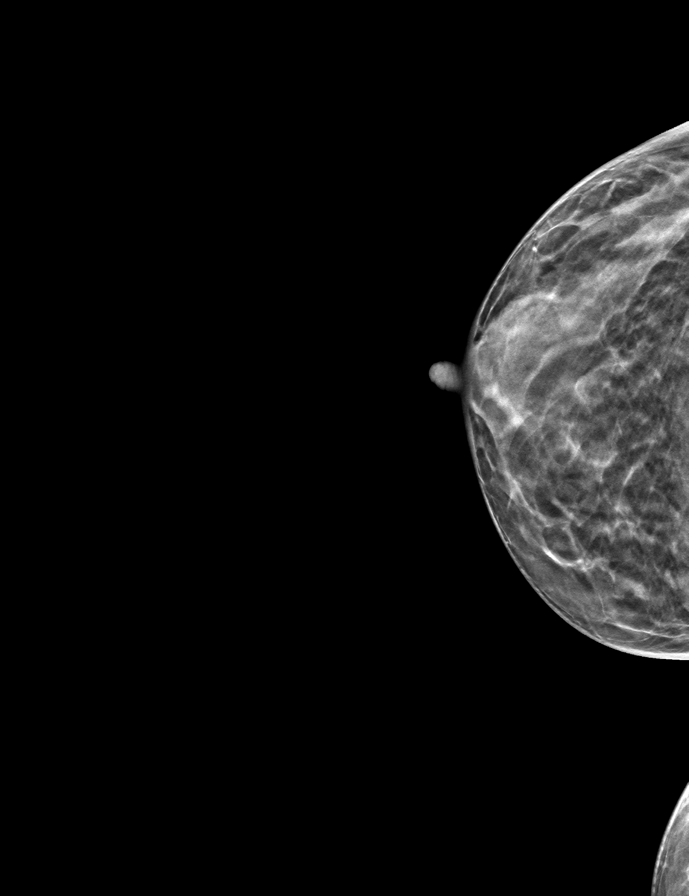
[frame 21/41]
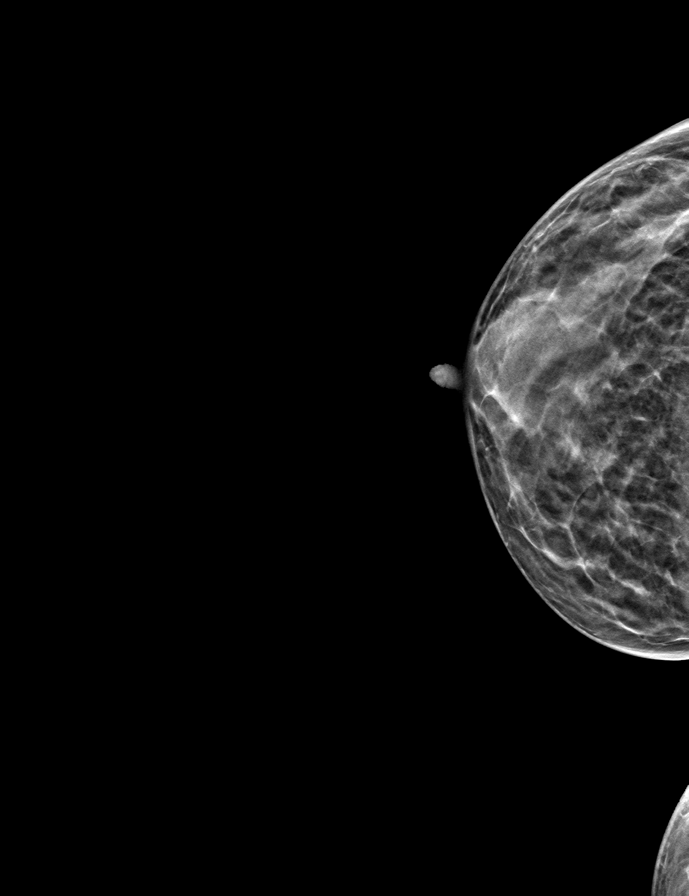

[L MLO tomo · tomo slice 21/42.0]
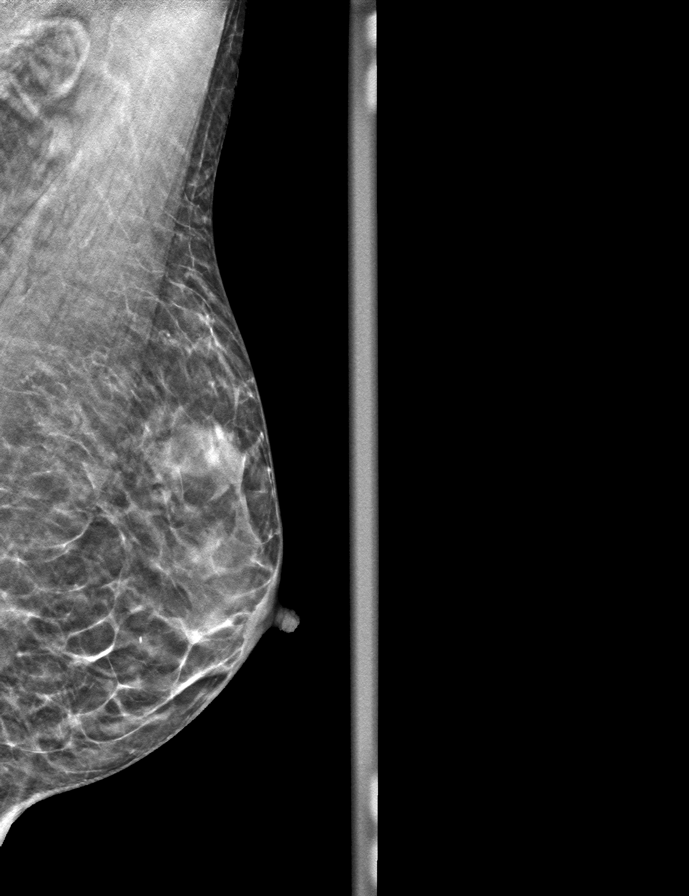

[L CC tomo · tomo slice 21/41.0]
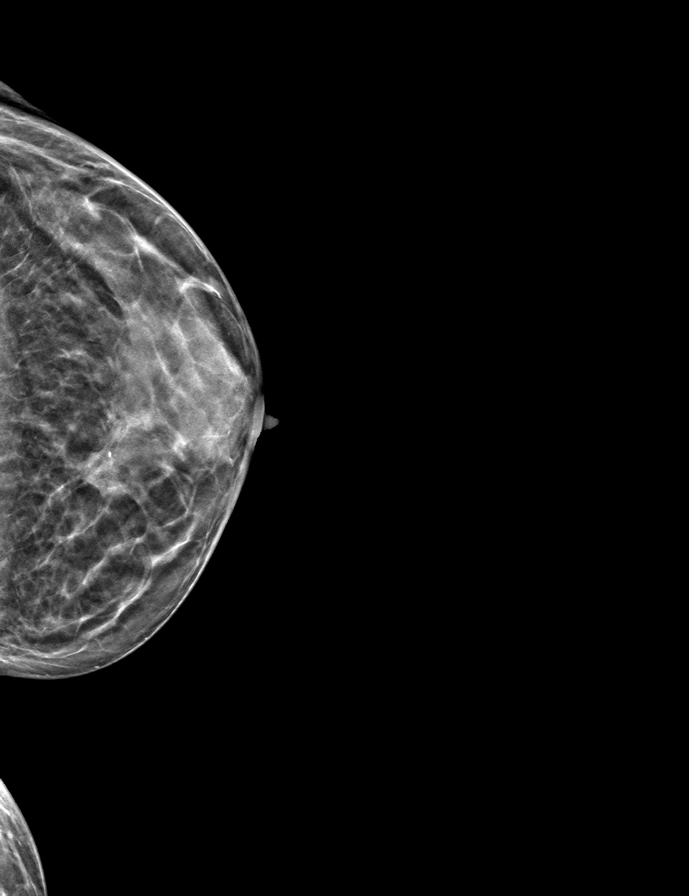

[R MLO tomo · tomo slice 18/35.0]
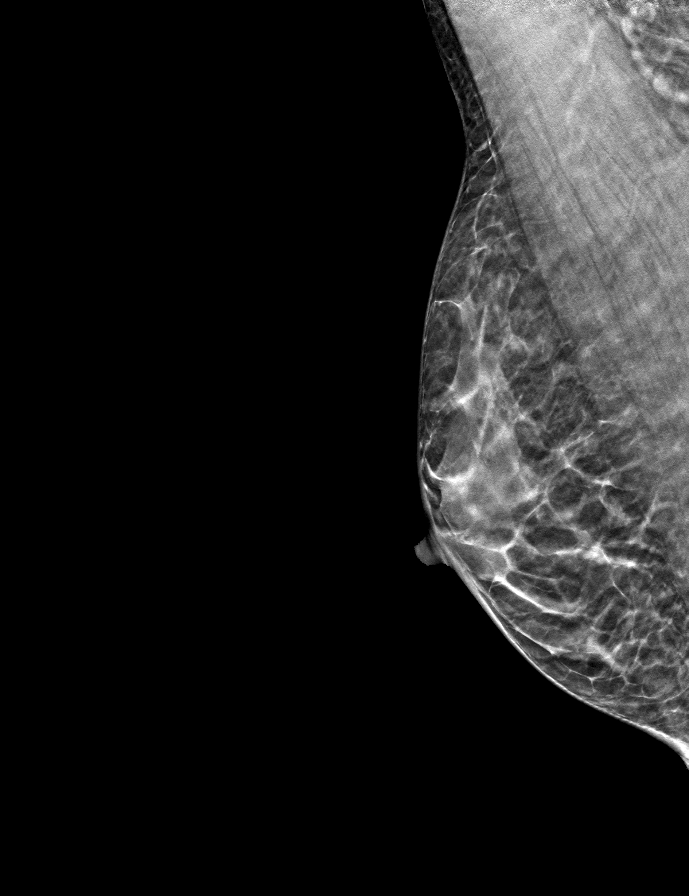

[9 of 24 positions shown; findings below may reference images not displayed]

ACR Breast Density Category c: The breast tissue is heterogeneously
dense, which may obscure small masses.
FINDINGS: There are no findings suspicious for malignancy.
IMPRESSION: No mammographic evidence of malignancy. A result letter of this
screening mammogram will be mailed directly to the patient.

RECOMMENDATION:
Screening mammogram in one year. (Code:Q3-W-BC3)

BI-RADS CATEGORY  1: Negative.

## 2022-02-24 ENCOUNTER — Other Ambulatory Visit: Payer: Self-pay | Admitting: Obstetrics & Gynecology

## 2022-02-24 DIAGNOSIS — Z1231 Encounter for screening mammogram for malignant neoplasm of breast: Secondary | ICD-10-CM

## 2022-04-19 ENCOUNTER — Ambulatory Visit: Payer: Managed Care, Other (non HMO)

## 2022-04-22 ENCOUNTER — Ambulatory Visit
Admission: RE | Admit: 2022-04-22 | Discharge: 2022-04-22 | Disposition: A | Discharge status: Home / Self Care | Payer: Managed Care, Other (non HMO) | Source: Ambulatory Visit | Attending: Obstetrics & Gynecology | Admitting: Obstetrics & Gynecology

## 2022-04-22 DIAGNOSIS — Z1231 Encounter for screening mammogram for malignant neoplasm of breast: Secondary | ICD-10-CM

## 2022-11-15 ENCOUNTER — Other Ambulatory Visit: Payer: Self-pay | Admitting: *Deleted

## 2022-11-15 DIAGNOSIS — E063 Autoimmune thyroiditis: Secondary | ICD-10-CM

## 2022-12-06 ENCOUNTER — Ambulatory Visit
Admission: RE | Admit: 2022-12-06 | Discharge: 2022-12-06 | Disposition: A | Discharge status: Home / Self Care | Payer: Managed Care, Other (non HMO) | Source: Ambulatory Visit | Attending: *Deleted | Admitting: *Deleted

## 2022-12-06 DIAGNOSIS — E063 Autoimmune thyroiditis: Secondary | ICD-10-CM

## 2023-01-18 ENCOUNTER — Other Ambulatory Visit: Payer: Self-pay | Admitting: *Deleted

## 2023-01-18 ENCOUNTER — Encounter: Payer: Self-pay | Admitting: *Deleted

## 2023-01-18 DIAGNOSIS — R590 Localized enlarged lymph nodes: Secondary | ICD-10-CM

## 2023-01-20 ENCOUNTER — Other Ambulatory Visit: Payer: Managed Care, Other (non HMO)

## 2023-02-18 ENCOUNTER — Other Ambulatory Visit (HOSPITAL_COMMUNITY): Payer: Self-pay | Admitting: Gastroenterology

## 2023-02-18 DIAGNOSIS — R140 Abdominal distension (gaseous): Secondary | ICD-10-CM

## 2023-02-23 ENCOUNTER — Ambulatory Visit (HOSPITAL_COMMUNITY): Payer: Managed Care, Other (non HMO)

## 2023-02-23 ENCOUNTER — Encounter (HOSPITAL_COMMUNITY): Payer: Self-pay

## 2023-02-25 ENCOUNTER — Ambulatory Visit: Payer: Managed Care, Other (non HMO) | Attending: Cardiology | Admitting: Cardiology

## 2023-02-25 ENCOUNTER — Encounter: Payer: Self-pay | Admitting: Cardiology

## 2023-02-25 VITALS — BP 86/64 | HR 50 | Ht 64.0 in | Wt 129.0 lb

## 2023-02-25 DIAGNOSIS — Z01812 Encounter for preprocedural laboratory examination: Secondary | ICD-10-CM

## 2023-02-25 DIAGNOSIS — R072 Precordial pain: Secondary | ICD-10-CM | POA: Diagnosis not present

## 2023-02-25 LAB — BASIC METABOLIC PANEL
BUN/Creatinine Ratio: 16 (ref 9–23)
BUN: 16 mg/dL (ref 6–24)
CO2: 23 mmol/L (ref 20–29)
Calcium: 9.9 mg/dL (ref 8.7–10.2)
Chloride: 101 mmol/L (ref 96–106)
Creatinine, Ser: 1.02 mg/dL — ABNORMAL HIGH (ref 0.57–1.00)
Glucose: 85 mg/dL (ref 70–99)
Potassium: 4.4 mmol/L (ref 3.5–5.2)
Sodium: 139 mmol/L (ref 134–144)
eGFR: 65 mL/min/{1.73_m2} (ref >59)

## 2023-02-25 NOTE — Progress Notes (Signed)
Cardiology Office Note:    Date:  02/25/2023   ID:  Karen Becker, DOB Jun 12, 1966, MRN [id number]  PCP:  Natalia Leatherwood, DO   [facility] Providers Cardiologist:  Donato Schultz, MD     Referring MD: Natalia Leatherwood, DO    History of Present Illness:    Karen Becker is a 57 y.o. female here for evaluation of palpitations. Prior seen 2015. Had hot spot above the femoral artery, femoral sheath was culprit. Low BP light headed. Mom and Dad aflutter and AFIB.   Palpitations -every Q3 months, can occasionally feel these.  This was the reason for her previous visit several years ago.  Her mother did have undiagnosed atrial flutter, ended up having a stroke.  Her mother did have Alzheimer's and subsequently has passed.  SOB and tight chest pain noted intermittently especially during periods of stress.  Her father for instance was in the hospital for quite some time and this did cause some chest discomfort.  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  She has incorporated keto diet into her regimen for the past 2 to 3 years.  Very active.  Never smoked.  Previous TSH was normal.  Mudlogger.  Resting heart rate 50.   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 felt to be possibly anxiety related.    Wears contact lenses     Past Surgical History:  Procedure Laterality Date   ABLATION  2015   Tonsils   CLEFT PALATE REPAIR     COLONOSCOPY     Dr. Loreta Ave, possible colon polyps. pt not certain. Seen for constipation per pt, but has had colonoscopyx2 since age 44    SEPTOPLASTY  11/2014   TENDON RECONSTRUCTION Right 12/02/2014   Procedure: Right elbow common extensor repair;  Surgeon: Mable Paris, MD;   Location: [facility];  Service: Orthopedics;  Laterality: Right;  Right elbow common extensor repair    Current Medications: Current Meds  Medication Sig   albuterol (ACCUNEB) 1.25 MG/3ML nebulizer solution Take 1 ampule by nebulization every 6 (six) hours as needed for wheezing (sport induced).   azelastine (ASTELIN) 0.1 % nasal spray Place 2 sprays into both nostrils 2 (two) times daily. Use in each nostril as directed   betamethasone dipropionate 0.05 % lotion 1 APPLICATION APPLY ON THE SKIN DAILY TO TWICE DAILY AS NEEDED FOR SCALP RASH AND ITCHING   cetirizine (ZYRTEC) 10 MG tablet Take 10 mg by mouth daily.   doxycycline (PERIOSTAT) 20 MG tablet Take 20 mg by mouth daily.   IPRATROPIUM BROMIDE IN Inhale into the lungs.   Multiple Vitamins-Calcium (ONE-A-DAY WOMENS FORMULA) TABS Take 1 tablet by mouth daily.   Nutritional Supplements (ESTROVEN PO) Take by mouth. Taking Mood in the morning and weight management at night.   SYNTHROID 112 MCG tablet TAKE 1 TABLET BY MOUTH DAILY   TURMERIC PO Take by mouth daily.     Allergies:   Adhesive [tape] and Latex   Social History   Socioeconomic History   Marital status: Married    Spouse name: bill   Number of children: 0   Years of  education: college   Highest education level: Not on file  Occupational History   Occupation: IT Investment banker, corporate: [employer]  Tobacco Use   Smoking status: Never   Smokeless tobacco: Never  Vaping Use   Vaping Use: Never used  Substance and Sexual Activity   Alcohol use: Yes    Alcohol/week: 4.0 standard drinks of alcohol    Types: 4 Glasses of wine per week   Drug use: No   Sexual activity: Yes    Partners: Male    Birth control/protection: None    Comment: Married  Other Topics Concern   Not on file  Social History Narrative   Married, 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    G0P0   Exercises routinely.    Takes a daily vitamin.    Uses herbal remedies.    Drinks caffeine.     Smoke alarm in the home.    Wears seatbelt.    Feels safe in her relationships.   Social Determinants of Health   Financial Resource Strain: Not on file  Food Insecurity: Not on file  Transportation Needs: Not on file  Physical Activity: Not on file  Stress: Not on file  Social Connections: Not on file     Family History: The patient's family history includes Alzheimer's disease in her mother; Atrial fibrillation in her mother; Bipolar disorder in her father; Breast cancer (age of onset: 23) in her maternal grandmother; Depression in her father; Diabetes Mellitus II in her father; Drug abuse in her father; Heart disease in her father; Hyperlipidemia in her father; Stroke in her mother.  ROS:   Please see the history of present illness.    No fevers chills nausea vomiting all other systems reviewed and are negative.  EKGs/Labs/Other Studies Reviewed:    The following studies were reviewed today:      EKG: Sinus bradycardia 50.  On Feb 25, 2023.  Recent Labs: No results found for requested labs within last 365 days.  Recent Lipid Panel    Component Value Date/Time   CHOL 160 11/25/2017 0840   TRIG 81 11/25/2017 0840   HDL 64 11/25/2017 0840   CHOLHDL 2.5 11/25/2017 0840   LDLCALC 80 11/25/2017 0840     Risk Assessment/Calculations:               Physical Exam:    VS:  BP (!) 86/64 (BP Location: Left Arm, Patient Position: Sitting, Cuff Size: Normal)   Pulse (!) 50   Ht 5\' 4"  (1.626 m)   Wt 129 lb (58.5 kg)   LMP  (LMP Unknown) Comment: has been at least 3 months  BMI 22.14 kg/m     Wt Readings from Last 3 Encounters:  02/25/23 129 lb (58.5 kg)  01/05/19 131 lb 4 oz (59.5 kg)  12/19/18 133 lb 3.2 oz (60.4 kg)     GEN:  Well nourished, well developed in no acute distress HEENT: Normal NECK: No JVD; No carotid bruits LYMPHATICS: No lymphadenopathy CARDIAC: RRR, no murmurs, rubs, gallops RESPIRATORY:  Clear to auscultation without rales, wheezing or  rhonchi  ABDOMEN: Soft, non-tender, non-distended MUSCULOSKELETAL:  No edema; No deformity  SKIN: Warm and dry NEUROLOGIC:  Alert and oriented x 3 PSYCHIATRIC:  Normal affect   ASSESSMENT:    1. Precordial pain   2. Pre-procedure lab exam    PLAN:    In order of problems listed above:  Precordial chest pain -Occasional chest tightness during stress.  With her  mother and father's prior cardiac history, we will go ahead and check a coronary CT scan to ensure proper structure.  Palpitations  -Still experiences occasional palpitations once maybe every 3 months.  If these intensify, can always pursue a ZIO monitor for further surveillance.  Most likely PVCs or PACs, premature ventricular contractions or premature atrial contractions. No high-risk symptoms such as syncope. EKG currently reassuring.  She also has sports-induced asthma and will occasionally take albuterol. This may also as a beta agonist stimulate PVCs or PACs.   Resting bradycardia -Heart rate 50 bpm on ECG.  Excellent.  Should allow for excellent CT scan.  Benign.  Discussed.         Medication Adjustments/Labs and Tests Ordered: Current medicines are reviewed at length with the patient today.  Concerns regarding medicines are outlined above.  Orders Placed This Encounter  Procedures   CT CORONARY MORPH W/CTA COR W/SCORE W/CA W/CM &/OR WO/CM   Basic metabolic panel   EKG 12-Lead   No orders of the defined types were placed in this encounter.   Patient Instructions  Medication Instructions:  The current medical regimen is effective;  continue present plan and medications.  *If you need a refill on your cardiac medications before your next appointment, please call your pharmacy*  Lab Work: Please have blood work today (BMP)  If you have labs (blood work) drawn today and your tests are completely normal, you will receive your results only by: MyChart Message (if you have MyChart) OR A paper copy in the  mail If you have any lab test that is abnormal or we need to change your treatment, we will call you to review the results.  Testing/Procedures:   Your cardiac CT will be scheduled at:   [facility] [address] [place], [place] [address] [phone]  Please arrive at the [facility] and Children's Entrance (Entrance C2) of [facility] 30 minutes prior to test start time. You can use the FREE valet parking offered at entrance C (encouraged to control the heart rate for the test)  Proceed to the [facility] Radiology Department (first floor) to check-in and test prep.  All radiology patients and guests should use entrance C2 at [facility], accessed from [facility], even though the hospital's physical address listed is [address]    Please follow these instructions carefully (unless otherwise directed):  On the Night Before the Test: Be sure to Drink plenty of water. Do not consume any caffeinated/decaffeinated beverages or chocolate 12 hours prior to your test. Do not take any antihistamines 12 hours prior to your test.  On the Day of the Test: Drink plenty of water until 1 hour prior to the test. Do not eat any food 1 hour prior to test. You may take your regular medications prior to the test.  Take metoprolol (Lopressor) two hours prior to test. If you take Furosemide/Hydrochlorothiazide/Spironolactone, please HOLD on the morning of the test. FEMALES- please wear underwire-free bra if available, avoid dresses & tight clothing  After the Test: Drink plenty of water. After receiving IV contrast, you may experience a mild flushed feeling. This is normal. On occasion, you may experience a mild rash up to 24 hours after the test. This is not dangerous. If this occurs, you can take Benadryl 25 mg and increase your fluid intake. If you experience trouble breathing, this can be serious. If it is severe call 911 IMMEDIATELY. If it  is mild,  please call our office. If you take any of these medications: Glipizide/Metformin, Avandament, Glucavance, please do not take 48 hours after completing test unless otherwise instructed.  We will call to schedule your test 2-4 weeks out understanding that some insurance companies will need an authorization prior to the service being performed.   For non-scheduling related questions, please contact the cardiac imaging nurse navigator should you have any questions/concerns: Rockwell Alexandria, Cardiac Imaging Nurse Navigator Larey Brick, Cardiac Imaging Nurse Navigator [facility] Direct Office Dial: [phone]   For scheduling needs, including cancellations and rescheduling, please call [place], [phone].  Follow-Up: At [facility], you and your health needs are our priority.  As part of our continuing mission to provide you with exceptional heart care, we have created designated Provider Care Teams.  These Care Teams include your primary Cardiologist (physician) and Advanced Practice Providers (APPs -  Physician Assistants and Nurse Practitioners) who all work together to provide you with the care you need, when you need it.  We recommend signing up for the patient portal called "MyChart".  Sign up information is provided on this After Visit Summary.  MyChart is used to connect with patients for Virtual Visits (Telemedicine).  Patients are able to view lab/test results, encounter notes, upcoming appointments, etc.  Non-urgent messages can be sent to your provider as well.   To learn more about what you can do with MyChart, go to [email or web address].    Your next appointment:   1 year(s)  Provider:   Dr Donato Schultz        Signed, Donato Schultz, MD  02/25/2023 9:16 AM    [facility]

## 2023-02-25 NOTE — Patient Instructions (Signed)
Medication Instructions:  The current medical regimen is effective;  continue present plan and medications.  *If you need a refill on your cardiac medications before your next appointment, please call your pharmacy*  Lab Work: Please have blood work today (BMP)  If you have labs (blood work) drawn today and your tests are completely normal, you will receive your results only by: MyChart Message (if you have MyChart) OR A paper copy in the mail If you have any lab test that is abnormal or we need to change your treatment, we will call you to review the results.  Testing/Procedures:   Your cardiac CT will be scheduled at:   [facility] [address] [place], [place] [address] [phone]  Please arrive at the [facility] and Children's Entrance (Entrance C2) of [facility] 30 minutes prior to test start time. You can use the FREE valet parking offered at entrance C (encouraged to control the heart rate for the test)  Proceed to the [facility] Radiology Department (first floor) to check-in and test prep.  All radiology patients and guests should use entrance C2 at [facility], accessed from [facility], even though the hospital's physical address listed is [address]    Please follow these instructions carefully (unless otherwise directed):  On the Night Before the Test: Be sure to Drink plenty of water. Do not consume any caffeinated/decaffeinated beverages or chocolate 12 hours prior to your test. Do not take any antihistamines 12 hours prior to your test.  On the Day of the Test: Drink plenty of water until 1 hour prior to the test. Do not eat any food 1 hour prior to test. You may take your regular medications prior to the test.  Take metoprolol (Lopressor) two hours prior to test. If you take Furosemide/Hydrochlorothiazide/Spironolactone, please HOLD on the morning of the test. FEMALES- please wear underwire-free bra  if available, avoid dresses & tight clothing  After the Test: Drink plenty of water. After receiving IV contrast, you may experience a mild flushed feeling. This is normal. On occasion, you may experience a mild rash up to 24 hours after the test. This is not dangerous. If this occurs, you can take Benadryl 25 mg and increase your fluid intake. If you experience trouble breathing, this can be serious. If it is severe call 911 IMMEDIATELY. If it is mild, please call our office. If you take any of these medications: Glipizide/Metformin, Avandament, Glucavance, please do not take 48 hours after completing test unless otherwise instructed.  We will call to schedule your test 2-4 weeks out understanding that some insurance companies will need an authorization prior to the service being performed.   For non-scheduling related questions, please contact the cardiac imaging nurse navigator should you have any questions/concerns: Rockwell Alexandria, Cardiac Imaging Nurse Navigator Larey Brick, Cardiac Imaging Nurse Navigator [facility] Direct Office Dial: [phone]   For scheduling needs, including cancellations and rescheduling, please call [place], [phone].  Follow-Up: At [facility], you and your health needs are our priority.  As part of our continuing mission to provide you with exceptional heart care, we have created designated Provider Care Teams.  These Care Teams include your primary Cardiologist (physician) and Advanced Practice Providers (APPs -  Physician Assistants and Nurse Practitioners) who all work together to provide you with the care you need, when you need it.  We recommend signing up for the patient portal called "MyChart".  Sign up information is provided on this After Visit Summary.  MyChart is used to connect with patients for Virtual Visits (Telemedicine).  Patients are able to view lab/test results, encounter notes, upcoming  appointments, etc.  Non-urgent messages can be sent to your provider as well.   To learn more about what you can do with MyChart, go to [email or web address].    Your next appointment:   1 year(s)  Provider:   Dr Donato Schultz

## 2023-02-28 ENCOUNTER — Other Ambulatory Visit: Payer: Self-pay | Admitting: *Deleted

## 2023-02-28 DIAGNOSIS — Z8249 Family history of ischemic heart disease and other diseases of the circulatory system: Secondary | ICD-10-CM

## 2023-03-01 ENCOUNTER — Encounter: Payer: Self-pay | Admitting: *Deleted

## 2023-03-08 ENCOUNTER — Telehealth: Payer: Self-pay | Admitting: Cardiology

## 2023-03-08 NOTE — Telephone Encounter (Signed)
Calling to see what her deductible for her CT will be. Patient states she needs to know prior to her appt. Please advise

## 2023-03-08 NOTE — Telephone Encounter (Signed)
Returned patient's call & left VM message (per DPR) indicating an estimate had been forwarded to her via MyChart.  Advised to call back should she have additional questions.

## 2023-03-11 ENCOUNTER — Telehealth: Payer: Self-pay | Admitting: Cardiology

## 2023-03-11 ENCOUNTER — Telehealth (HOSPITAL_COMMUNITY): Payer: Self-pay | Admitting: *Deleted

## 2023-03-11 ENCOUNTER — Encounter: Payer: Self-pay | Admitting: Cardiology

## 2023-03-11 NOTE — Telephone Encounter (Signed)
Patient stated she received a letter stating showing what her co-pay and full charges for her upcoming test will be but patient states she wants to cancel her test as it will be too expensive for her.  Patient requests a call back today as the test is scheduled for Tuesday (5/28).

## 2023-03-11 NOTE — Telephone Encounter (Signed)
Attempted to call patient regarding upcoming cardiac CT appointment. °Left message on voicemail with name and callback number ° °Lew Prout RN Navigator Cardiac Imaging °[facility] °[phone] Office °[phone] Cell ° °

## 2023-03-15 ENCOUNTER — Ambulatory Visit (HOSPITAL_COMMUNITY): Admission: RE | Admit: 2023-03-15 | Payer: Managed Care, Other (non HMO) | Source: Ambulatory Visit

## 2023-03-15 NOTE — Telephone Encounter (Signed)
Noted! Thank you

## 2023-03-28 ENCOUNTER — Other Ambulatory Visit: Payer: Self-pay | Admitting: Obstetrics & Gynecology

## 2023-03-28 DIAGNOSIS — Z1231 Encounter for screening mammogram for malignant neoplasm of breast: Secondary | ICD-10-CM

## 2023-04-08 ENCOUNTER — Ambulatory Visit
Admission: RE | Admit: 2023-04-08 | Discharge: 2023-04-08 | Disposition: A | Discharge status: Home / Self Care | Payer: No Typology Code available for payment source | Source: Ambulatory Visit | Attending: *Deleted | Admitting: *Deleted

## 2023-04-08 DIAGNOSIS — Z8249 Family history of ischemic heart disease and other diseases of the circulatory system: Secondary | ICD-10-CM

## 2023-04-26 ENCOUNTER — Ambulatory Visit: Payer: Managed Care, Other (non HMO)

## 2023-04-27 ENCOUNTER — Ambulatory Visit
Admission: RE | Admit: 2023-04-27 | Discharge: 2023-04-27 | Disposition: A | Discharge status: Home / Self Care | Payer: Managed Care, Other (non HMO) | Source: Ambulatory Visit | Attending: Obstetrics & Gynecology | Admitting: Obstetrics & Gynecology

## 2023-04-27 DIAGNOSIS — Z1231 Encounter for screening mammogram for malignant neoplasm of breast: Secondary | ICD-10-CM

## 2024-03-01 ENCOUNTER — Ambulatory Visit (HOSPITAL_BASED_OUTPATIENT_CLINIC_OR_DEPARTMENT_OTHER): Admitting: Cardiology

## 2024-03-01 VITALS — BP 96/68 | HR 58 | Ht 64.0 in | Wt 129.5 lb

## 2024-03-01 DIAGNOSIS — R072 Precordial pain: Secondary | ICD-10-CM | POA: Diagnosis not present

## 2024-03-01 DIAGNOSIS — I951 Orthostatic hypotension: Secondary | ICD-10-CM | POA: Diagnosis not present

## 2024-03-01 DIAGNOSIS — R002 Palpitations: Secondary | ICD-10-CM | POA: Diagnosis not present

## 2024-03-01 NOTE — Patient Instructions (Signed)
 Medication Instructions:  Your physician recommends that you continue on your current medications as directed. Please refer to the Current Medication list given to you today.   Labwork: NONE  Testing/Procedures: NONE  Follow-Up: AS NEEDED   Any Other Special Instructions Will Be Listed Below (If Applicable). STAY WELL HYDRATED

## 2024-03-01 NOTE — Progress Notes (Signed)
 Cardiology Office Note:  .   Date:  03/01/2024  ID:  Karen Becker, DOB Oct 29, 1965, MRN [id number] PCP: Norrine Bedford, CRNP  [facility] Providers Cardiologist:  Dorothye Gathers, MD     History of Present Illness: Karen Becker is a 58 y.o. female Discussed the use of AI scribe software for clinical note transcription with the patient, who gave verbal consent to proceed.  History of Present Illness A 58 year old female who presents for follow-up regarding her cardiovascular health.  She experiences palpitations approximately every three months, described as episodes where her heart feels like it is skipping or quivering, followed by a strong thud. She is concerned about these symptoms due to her family history of atrial flutter and atrial fibrillation, with her father having both conditions and her mother having undiagnosed atrial flutter that led to a stroke.  She experiences low blood pressure and feels lightheaded more frequently, especially with positional changes such as standing up quickly and after taking hot baths with Epsom salts. She describes an episode of vertigo lasting almost three months, which has since been intermittent. Orthostatic testing with her primary care provider did not show significant findings. She is exploring natural ways to manage her symptoms, such as increasing salt intake and hydration.  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      ROS: no syncope  Studies Reviewed: Aaron Aas    EKG Interpretation Date/Time:  Thursday Mar 01 2024 08:44:18 EDT Ventricular Rate:  49 PR Interval:  202 QRS Duration:  76 QT Interval:  448 QTC Calculation: 404 R Axis:   75  Text Interpretation: Sinus bradycardia No previous ECGs available Confirmed by Dorothye Gathers ([id number]) on 03/01/2024 9:07:15 AM    Results LABS LDL cholesterol: 107 mg/dL  RADIOLOGY CT calcium score: 0  (04/08/2023)  DIAGNOSTIC EKG: Normal (03/01/2024)  Risk Assessment/Calculations:            Physical Exam:   VS:  BP 96/68   Pulse (!) 58   Ht 5\' 4"  (1.626 m)   Wt 129 lb 8 oz (58.7 kg)   LMP  (LMP Unknown) Comment: has been at least 3 months  SpO2 98%   BMI 22.23 kg/m    Wt Readings from Last 3 Encounters:  03/01/24 129 lb 8 oz (58.7 kg)  02/25/23 129 lb (58.5 kg)  01/05/19 131 lb 4 oz (59.5 kg)    GEN: Well nourished, well developed in no acute distress NECK: No JVD; No carotid bruits CARDIAC: RRR, no murmurs, no rubs, no gallops RESPIRATORY:  Clear to auscultation without rales, wheezing or rhonchi  ABDOMEN: Soft, non-tender, non-distended EXTREMITIES:  No edema; No deformity   ASSESSMENT AND PLAN: .    Assessment and Plan Assessment & Plan Palpitations Intermittent palpitations occurring every three months or more frequently, likely due to premature ventricular contractions (PVCs) or premature atrial contractions (PACs). No evidence of atrial flutter or atrial fibrillation on EKG. Family history of atrial flutter and atrial fibrillation. Palpitations are described as subtle muscle twitches and are not harmful. - Reassure that palpitations are not harmful and do not indicate atrial flutter or fibrillation. - Monitor and report any changes or increased frequency.  Resting bradycardia Resting bradycardia likely due to high level of physical activity. No concerns with current heart rate.  Low blood pressure Low blood pressure with episodes of lightheadedness, particularly when standing up quickly or after being in a  hot bath.  Managed with hydration and salt liberalization. Discussed vasodilation in warm environments leading to decreased blood pressure. - Increase hydration and salt intake, possibly using liquid IV supplements. - Avoid rapid position changes and be cautious when getting up from hot baths. - Monitor symptoms and report any significant changes.          Dispo: As needed  Signed, Dorothye Gathers, MD

## 2024-04-02 ENCOUNTER — Other Ambulatory Visit: Payer: Self-pay | Admitting: Obstetrics & Gynecology

## 2024-04-02 DIAGNOSIS — Z1231 Encounter for screening mammogram for malignant neoplasm of breast: Secondary | ICD-10-CM

## 2024-04-30 ENCOUNTER — Ambulatory Visit
Admission: RE | Admit: 2024-04-30 | Discharge: 2024-04-30 | Disposition: A | Discharge status: Home / Self Care | Source: Ambulatory Visit | Attending: Obstetrics & Gynecology | Admitting: Obstetrics & Gynecology

## 2024-04-30 DIAGNOSIS — Z1231 Encounter for screening mammogram for malignant neoplasm of breast: Secondary | ICD-10-CM
